# Patient Record
Sex: Male | Born: 1940 | Hispanic: No | Marital: Married | Smoking: Former smoker
Health system: Southern US, Community
[De-identification: ages and names within clinical notes are randomized; demographics above are authoritative.]

## PROBLEM LIST (undated history)

## (undated) DIAGNOSIS — C4492 Squamous cell carcinoma of skin, unspecified: Secondary | ICD-10-CM

## (undated) DIAGNOSIS — C4491 Basal cell carcinoma of skin, unspecified: Secondary | ICD-10-CM

## (undated) DIAGNOSIS — I2510 Atherosclerotic heart disease of native coronary artery without angina pectoris: Secondary | ICD-10-CM

## (undated) HISTORY — DX: Atherosclerotic heart disease of native coronary artery without angina pectoris: I25.10

## (undated) HISTORY — PX: CORONARY ARTERY BYPASS GRAFT: SHX141

---
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
## 1991-10-11 HISTORY — PX: FACIAL LACERATIONS REPAIR: SHX1571

## 1991-10-11 HISTORY — PX: WRIST FRACTURE SURGERY: SHX121

## 2010-08-09 ENCOUNTER — Encounter: Payer: Self-pay | Admitting: Cardiology

## 2010-08-19 ENCOUNTER — Encounter: Payer: Self-pay | Admitting: Gastroenterology

## 2010-09-15 ENCOUNTER — Ambulatory Visit: Payer: Self-pay | Admitting: Gastroenterology

## 2010-09-15 ENCOUNTER — Telehealth (INDEPENDENT_AMBULATORY_CARE_PROVIDER_SITE_OTHER): Payer: Self-pay | Admitting: *Deleted

## 2010-09-15 ENCOUNTER — Ambulatory Visit: Payer: Self-pay | Admitting: Cardiology

## 2010-09-15 DIAGNOSIS — Z951 Presence of aortocoronary bypass graft: Secondary | ICD-10-CM

## 2010-09-15 DIAGNOSIS — E785 Hyperlipidemia, unspecified: Secondary | ICD-10-CM | POA: Insufficient documentation

## 2010-09-15 DIAGNOSIS — I10 Essential (primary) hypertension: Secondary | ICD-10-CM

## 2010-09-15 DIAGNOSIS — E669 Obesity, unspecified: Secondary | ICD-10-CM

## 2010-11-09 NOTE — Assessment & Plan Note (Signed)
Summary: [facility]   Visit Type:  Follow-up Primary Provider:  Dr. Kyra Manges  CC:  CAD/CABG.  History of Present Illness: The patient presents for initial evaluation with our group. He reports two-vessel bypass in 1998 at [facility]. He doesn't report any other testing since that time. He says he has done well he doesn't see doctors. He recently saw Dr. Elana Alm for the first time. He was referred here. He has no symptoms. He says he walks routinely. He has not been on discomfort that was his previous angina. He denies any chest discomfort, neck discomfort. He doesn't report any shortness of breath, PND or orthopnea. He has no palpitations, presyncope or syncope. He has had no edema.  Current Medications (verified): 1)  Aspirin 325 Mg  Tabs (Aspirin) .... As Needed 2)  Ra Vitamin E-Vit A & D 20000 Unit Crea (Vitamin E-Vit  A & D) .Marland Kitchen.. 1 By Mouth Daily  Allergies (verified): No Known Drug Allergies  Past History:  Past Medical History: CAD  Past Surgical History: CABG (2 vessel) Repair of wrist fracture and facial trauma 1993  Family History: Negative for early coronary artery disease though he does not know all the details of his family history.  Social History: He smoked briefly quitting in 1969. He is retired. He is married.  Review of Systems       positive for mild joint pain. Otherwise stated in the history of present illness negative for all other systems.  Vital Signs:  Patient profile:   70 year old male Height:      69 inches Weight:      226 pounds BMI:     33.50 Pulse rate:   67 / minute Resp:     16 per minute BP sitting:   156 / 102  (right arm)  Vitals Entered By: Marrion Coy, CNA (September 15, 2010 1:13 PM)  Physical Exam  General:  Well developed, well nourished, in no acute distress. Head:  normocephalic and atraumatic Eyes:  PERRLA/EOM intact; conjunctiva and lids normal. Mouth:  Teeth, gums and palate normal. Oral mucosa normal. Neck:   Neck supple, no JVD. No masses, thyromegaly or abnormal cervical nodes. Chest Wall:  Well-healed surgical scar Lungs:  Clear bilaterally to auscultation and percussion. Abdomen:  Bowel sounds positive; abdomen soft and non-tender without masses, organomegaly, or hernias noted. No hepatosplenomegaly, obese Msk:  Back normal, normal gait. Muscle strength and tone normal. Extremities:  No clubbing or cyanosis. Neurologic:  Alert and oriented x 3. Skin:  Intact without lesions or rashes. Cervical Nodes:  no significant adenopathy Inguinal Nodes:  no significant adenopathy Psych:  Normal affect.   Detailed Cardiovascular Exam  Neck    Carotids: Carotids full and equal bilaterally without bruits.      Neck Veins: Normal, no JVD.    Heart    Inspection: no deformities or lifts noted.      Palpation: normal PMI with no thrills palpable.      Auscultation: regular rate and rhythm, S1, S2 without murmurs, rubs, gallops, or clicks.    Vascular    Abdominal Aorta: no palpable masses, pulsations, or audible bruits.      Femoral Pulses: normal femoral pulses bilaterally.      Pedal Pulses: normal pedal pulses bilaterally.      Radial Pulses: normal radial pulses bilaterally.      Peripheral Circulation: no clubbing, cyanosis, or edema noted with normal capillary refill.     EKG  Procedure date:  08/09/2010  Findings:      Sinus rhythm, leftward axis, rate 68, nonspecific lateral T wave flattening  Impression & Recommendations:  Problem # 1:  CORONARY ARTERY BYPASS GRAFT, HX OF (ICD-V45.81) He has no symptoms but has 70 year old bypass grafts. Exercise treadmill testing is indicated and this can be done at his primary physician's office.  Problem # 2:  DYSLIPIDEMIA (ICD-272.4) His lipids are at target off of meds.  He wants to avoid meds.  The Heart Protection Study would suggest a benefit to Zocor but I will not push this as the benefit is somewhat marginal vs healthy  lifestyle.  Problem # 3:  OBESITY, UNSPECIFIED (ICD-278.00) We discussed weight loss.  Problem # 4:  ESSENTIAL HYPERTENSION, BENIGN (ICD-401.1) He says his blood pressure is never otherwise elevated and it wasn't at his primary care office. I have given him specific instructions on keeping a blood pressure diary with a goal 140/90 or less.  Patient Instructions: 1)  Your physician recommends that you schedule a follow-up appointment in 18 months. 2)  Your physician recommends that you continue on your current medications as directed. Please refer to the Current Medication list given to you today. 3)  Your physician has requested that you have an exercise tolerance test.  For further information please visit [email or web address]  This test needs to be done at [facility]

## 2010-11-09 NOTE — Letter (Signed)
Summary: Pre Visit Letter Revised  [facility]  [address] [place], [place] [address]   Phone: [phone]  Fax: [phone]        08/19/2010 MRN: [id number]  [facility] [address] [place], [place]  [address]             Procedure Date:  12-21 at 11:30am   Welcome to the Gastroenterology Division at [facility]    You are scheduled to see a nurse for your pre-procedure visit on 09-15-10 at 3:30pm on the 3rd floor at [facility], [address]  We ask that you try to arrive at our office 15 minutes prior to your appointment time to allow for check-in.  Please take a minute to review the attached form.  If you answer "Yes" to one or more of the questions on the first page, we ask that you call the person listed at your earliest opportunity.  If you answer "No" to all of the questions, please complete the rest of the form and bring it to your appointment.    Your nurse visit will consist of discussing your medical and surgical history, your immediate family medical history, and your medications.   If you are unable to list all of your medications on the form, please bring the medication bottles to your appointment and we will list them.  We will need to be aware of both prescribed and over the counter drugs.  We will need to know exact dosage information as well.    Please be prepared to read and sign documents such as consent forms, a financial agreement, and acknowledgement forms.  If necessary, and with your consent, a friend or relative is welcome to sit-in on the nurse visit with you.  Please bring your insurance card so that we may make a copy of it.  If your insurance requires a referral to see a specialist, please bring your referral form from your primary care physician.  No co-pay is required for this nurse visit.     If you cannot keep your appointment, please call [phone] to cancel or reschedule prior to your appointment date.   This allows [place] the opportunity to schedule an appointment for another patient in need of care.    Thank you for choosing [facility] for your medical needs.  We appreciate the opportunity to care for you.  Please visit [place] at our website  to learn more about our practice.  Sincerely, The Gastroenterology Division

## 2010-11-09 NOTE — Progress Notes (Signed)
Summary: Nos for pv  Phone Note Call from Patient   Caller: Patient Details for Reason: pt. nos for pv Summary of Call: Pt. wanted to cancel Colon.  Advised him to call us when he was ready to have his colon.  Pt. acknowledged advice. Initial call taken by: Wyona Almas RN,  September 15, 2010 4:33 PM

## 2010-11-11 NOTE — Consult Note (Signed)
Summary: [facility]   [facility] Referral Request   Imported By: Roderic Ovens 09/23/2010 15:30:23  _____________________________________________________________________  External Attachment:    Type:   Image     Comment:   External Document

## 2011-05-17 ENCOUNTER — Encounter: Payer: Self-pay | Admitting: Cardiology

## 2014-09-02 DIAGNOSIS — C4492 Squamous cell carcinoma of skin, unspecified: Secondary | ICD-10-CM

## 2014-09-02 DIAGNOSIS — C4491 Basal cell carcinoma of skin, unspecified: Secondary | ICD-10-CM

## 2014-09-02 HISTORY — DX: Squamous cell carcinoma of skin, unspecified: C44.92

## 2014-09-02 HISTORY — DX: Basal cell carcinoma of skin, unspecified: C44.91

## 2017-10-10 DIAGNOSIS — C189 Malignant neoplasm of colon, unspecified: Secondary | ICD-10-CM

## 2017-10-10 HISTORY — DX: Malignant neoplasm of colon, unspecified: C18.9

## 2019-04-23 ENCOUNTER — Encounter: Payer: Self-pay | Admitting: *Deleted

## 2019-09-10 DIAGNOSIS — C8590 Non-Hodgkin lymphoma, unspecified, unspecified site: Secondary | ICD-10-CM

## 2019-09-10 HISTORY — DX: Non-Hodgkin lymphoma, unspecified, unspecified site: C85.90

## 2020-03-26 ENCOUNTER — Encounter: Payer: Self-pay | Admitting: Physician Assistant

## 2020-03-26 ENCOUNTER — Other Ambulatory Visit: Payer: Self-pay

## 2020-03-26 ENCOUNTER — Ambulatory Visit (INDEPENDENT_AMBULATORY_CARE_PROVIDER_SITE_OTHER): Payer: Medicare Other | Admitting: Physician Assistant

## 2020-03-26 DIAGNOSIS — L570 Actinic keratosis: Secondary | ICD-10-CM

## 2020-03-26 DIAGNOSIS — D098 Carcinoma in situ of other specified sites: Secondary | ICD-10-CM

## 2020-03-26 DIAGNOSIS — Z85828 Personal history of other malignant neoplasm of skin: Secondary | ICD-10-CM

## 2020-03-26 DIAGNOSIS — D0439 Carcinoma in situ of skin of other parts of face: Secondary | ICD-10-CM | POA: Diagnosis not present

## 2020-03-26 DIAGNOSIS — Z1283 Encounter for screening for malignant neoplasm of skin: Secondary | ICD-10-CM

## 2020-03-26 NOTE — Patient Instructions (Signed)

## 2020-03-26 NOTE — Progress Notes (Addendum)
   Follow-Up Visit   Subjective  Glen Powers is a 79 y.o. male who presents for the following: Skin Problem (right side of nose x several months-  no bleed, just wont heal).   The following portions of the chart were reviewed this encounter and updated as appropriate: Allergies  Meds  Problems  Med Hx  Surg Hx  Fam Hx      Objective  Well appearing patient in no apparent distress; mood and affect are within normal limits.  All skin waist up examined.  Objective  Left Buccal Cheek , Left Ear, Right Buccal Cheek  (2), Right Ear: Erythematous patches with gritty scale.  Objective  Left Ear: Scar clear  Objective  Right Temple: Scar clear- no  signs of nonmole skin ca  Objective  Dorsum of Nose: Hyperkeratotic scale with pink base -please check margins     Assessment & Plan  AK (actinic keratosis) (5) Left Ear; Right Ear; Left Buccal Cheek ; Right Buccal Cheek  (2)  Destruction of lesion - Left Buccal Cheek , Left Ear, Right Buccal Cheek  (2), Right Ear Complexity: simple   Destruction method: cryotherapy   Informed consent: discussed and consent obtained   Timeout:  patient name, date of birth, surgical site, and procedure verified Lesion destroyed using liquid nitrogen: Yes   Cryotherapy cycles:  1 Outcome: patient tolerated procedure well with no complications   Post-procedure details: wound care instructions given    Screening exam for skin cancer waist up exam  History of basal cell carcinoma (BCC) Left Ear  Skin exam  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  Skin exams  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  Epidermal / dermal shaving  Lesion diameter (cm):  1 Informed consent: discussed and consent obtained   Timeout: patient name, date of birth, surgical site, and procedure verified   Procedure prep:  Patient was prepped and draped in usual sterile fashion Prep type:  Chlorhexidine Anesthesia: the  lesion was anesthetized in a standard fashion   Anesthetic:  1% lidocaine w/ epinephrine 1-100,000 local infiltration Instrument used: DermaBlade   Hemostasis achieved with: aluminum chloride   Outcome: patient tolerated procedure well   Post-procedure details: sterile dressing applied and wound care instructions given   Dressing type: petrolatum gauze, petrolatum and bandage    Specimen 1 - Surgical pathology Differential Diagnosis: bcc vs scc Check Margins: No

## 2020-04-07 ENCOUNTER — Telehealth: Payer: Self-pay | Admitting: *Deleted

## 2020-04-07 NOTE — Telephone Encounter (Signed)
-----   Message from Warren Danes, [place] sent at 04/06/2020  1:03 PM EDT ----- Jury Caserta margin. RTC if recurs

## 2020-04-07 NOTE — Telephone Encounter (Signed)
Path to patient. He will follow up if spot comes back.

## 2021-01-16 ENCOUNTER — Encounter (HOSPITAL_COMMUNITY): Payer: Self-pay

## 2021-01-16 ENCOUNTER — Other Ambulatory Visit: Payer: Self-pay

## 2021-01-16 ENCOUNTER — Emergency Department (HOSPITAL_COMMUNITY)
Admission: EM | Admit: 2021-01-16 | Discharge: 2021-01-16 | Disposition: A | Discharge status: Home / Self Care | Payer: Medicare Other | Attending: Emergency Medicine | Admitting: Emergency Medicine

## 2021-01-16 ENCOUNTER — Emergency Department (HOSPITAL_COMMUNITY): Payer: Medicare Other

## 2021-01-16 DIAGNOSIS — I2510 Atherosclerotic heart disease of native coronary artery without angina pectoris: Secondary | ICD-10-CM | POA: Diagnosis not present

## 2021-01-16 DIAGNOSIS — R079 Chest pain, unspecified: Secondary | ICD-10-CM | POA: Insufficient documentation

## 2021-01-16 DIAGNOSIS — W01198A Fall on same level from slipping, tripping and stumbling with subsequent striking against other object, initial encounter: Secondary | ICD-10-CM | POA: Insufficient documentation

## 2021-01-16 DIAGNOSIS — Z85038 Personal history of other malignant neoplasm of large intestine: Secondary | ICD-10-CM | POA: Insufficient documentation

## 2021-01-16 DIAGNOSIS — Z87891 Personal history of nicotine dependence: Secondary | ICD-10-CM | POA: Insufficient documentation

## 2021-01-16 DIAGNOSIS — Z85828 Personal history of other malignant neoplasm of skin: Secondary | ICD-10-CM | POA: Insufficient documentation

## 2021-01-16 DIAGNOSIS — M25512 Pain in left shoulder: Secondary | ICD-10-CM | POA: Diagnosis not present

## 2021-01-16 DIAGNOSIS — Z79899 Other long term (current) drug therapy: Secondary | ICD-10-CM | POA: Insufficient documentation

## 2021-01-16 DIAGNOSIS — S30811A Abrasion of abdominal wall, initial encounter: Secondary | ICD-10-CM | POA: Insufficient documentation

## 2021-01-16 DIAGNOSIS — S3991XA Unspecified injury of abdomen, initial encounter: Secondary | ICD-10-CM | POA: Diagnosis present

## 2021-01-16 DIAGNOSIS — Z951 Presence of aortocoronary bypass graft: Secondary | ICD-10-CM | POA: Insufficient documentation

## 2021-01-16 DIAGNOSIS — W25XXXA Contact with sharp glass, initial encounter: Secondary | ICD-10-CM | POA: Diagnosis not present

## 2021-01-16 DIAGNOSIS — Y9389 Activity, other specified: Secondary | ICD-10-CM | POA: Insufficient documentation

## 2021-01-16 DIAGNOSIS — Z7982 Long term (current) use of aspirin: Secondary | ICD-10-CM | POA: Diagnosis not present

## 2021-01-16 MED ORDER — ACETAMINOPHEN 500 MG PO TABS
1000.0000 mg | ORAL_TABLET | Freq: Once | ORAL | Status: AC
  Administered 2021-01-16: 1000 mg via ORAL
  Filled 2021-01-16: qty 2

## 2021-01-16 NOTE — ED Provider Notes (Signed)
[facility] EMERGENCY DEPARTMENT Provider Note   CSN: [id number] Arrival date & time: 01/16/21  0214     History Chief Complaint  Patient presents with  . Fall    Glen Powers is a 80 y.o. male.  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  Patient states he was sleepwalking tonight he woke up on the floor.  Swallowed a lot of broken glass.  Initially had some pain around his left shoulder and anterior upper left chest thought he had a collarbone fracture.  He states that since that time the pain is improved.  He did sustain a abrasion to his right lower abdomen.  No obvious laceration.  Patient states that he does not have a headache, midline neck or back pain.  No extremity pain.  No pelvis pain.  Patient is ambulatory. No blood thinners.     Past Medical History:  Diagnosis Date  . BCC (basal cell carcinoma) 09/02/2014   left ear-[facility]HS  . Colon cancer ([place]) 2019  . Coronary artery disease   . Lymphoma ([place]) 09/2019  . SCC (squamous cell carcinoma) 09/02/2014   right temple- [place]    Patient Active Problem List   Diagnosis Date Noted  . DYSLIPIDEMIA 09/15/2010  . OBESITY, UNSPECIFIED 09/15/2010  . ESSENTIAL HYPERTENSION, BENIGN 09/15/2010  . CORONARY ARTERY BYPASS GRAFT, HX OF 09/15/2010    Past Surgical History:  Procedure Laterality Date  . CORONARY ARTERY BYPASS GRAFT     2 vessel  . FACIAL LACERATIONS REPAIR  1993   Facial trauma  . WRIST FRACTURE SURGERY  1993       Family History  Problem Relation Age of Onset  . Coronary artery disease Neg Hx        Early    Social History   Tobacco Use  . Smoking status: Former Smoker    Quit date: 10/11/1967    Years since quitting: 53.3  . Smokeless tobacco: Never Used  . Tobacco comment: Smoked briefly  Vaping Use  . Vaping Use: Never used  Substance Use Topics  . Alcohol use: Not Currently    Home Medications Prior to Admission medications   Medication Sig Start Date End Date Taking?  Authorizing Provider  aspirin EC 81 MG tablet Take 81 mg by mouth daily. Swallow whole.    [provider]  ferrous sulfate 325 (65 FE) MG tablet Take 325 mg by mouth daily with breakfast.    [provider]  furosemide (LASIX) 20 MG tablet Take 20 mg by mouth.    [provider]  isosorbide mononitrate (IMDUR) 60 MG 24 hr tablet Take 60 mg by mouth daily.    [provider]  metoprolol succinate (TOPROL-XL) 25 MG 24 hr tablet Take 25 mg by mouth daily.    [provider]  nitroGLYCERIN (NITROSTAT) 0.4 MG SL tablet Place 0.4 mg under the tongue every 5 (five) minutes as needed for chest pain.    [provider]  pyridOXINE (VITAMIN B-6) 100 MG tablet Take 100 mg by mouth daily.    [provider]  RA VITAMIN E-VIT A & D 20000 UNITS CREA Apply 20,000 Units topically daily.      [provider]  rosuvastatin (CRESTOR) 10 MG tablet Take 10 mg by mouth daily.    [provider]    Allergies    Flonase [fluticasone] and Lisinopril  Review of Systems   Review of Systems  All other systems reviewed and are negative.  Physical Exam Updated Vital Signs BP (!) 154/87 (BP Location: Right Arm)   Pulse (!) 59   Temp 98.4 F (36.9 C)   Resp 16   Ht 5\' 9"  (1.753 m)   Wt 95.3 kg   SpO2 95%   BMI 31.01 kg/m   Physical Exam Vitals and nursing note reviewed.  Constitutional:      Appearance: He is well-developed.  HENT:     Head: Normocephalic and atraumatic.     Mouth/Throat:     Mouth: Mucous membranes are moist.     Pharynx: Oropharynx is clear.  Eyes:     Pupils: Pupils are equal, round, and reactive to light.  Cardiovascular:     Rate and Rhythm: Normal rate.  Pulmonary:     Effort: Pulmonary effort is normal. No respiratory distress.  Abdominal:     General: Abdomen is flat. There is no distension.  Musculoskeletal:     Cervical back: Normal range of motion.     Comments: No cervical spine  tenderness, thoracic spine tenderness or Lumbar spine tenderness.  No tenderness or pain with palpation and full ROM of all joints in upper and lower extremities.  No ecchymosis or other signs of trauma on back or extremities.  No Pain with AP or lateral compression of ribs.  Mild Paracervical ttp but no paraspinal ttp   Skin:    General: Skin is warm and dry.     Comments: Abrasion to right lower abdomen, no puncture, no laceration.   Neurological:     General: No focal deficit present.     Mental Status: He is alert.     ED Results / Procedures / Treatments   Labs (all labs ordered are listed, but only abnormal results are displayed) Labs Reviewed - No data to display  EKG None  Radiology DG Chest Portable 1 View  Result Date: 01/16/2021 CLINICAL DATA:  Status post fall. EXAM: PORTABLE CHEST 1 VIEW COMPARISON:  None. FINDINGS: Multiple sternal wires and vascular clips are seen. There is no evidence of acute infiltrate, pleural effusion or pneumothorax. There is moderate to marked severity enlargement of the cardiac silhouette. Moderate to marked severity calcification of the aortic arch is noted. The visualized skeletal structures are unremarkable. IMPRESSION: 1. Evidence of prior median sternotomy/CABG. 2. No acute or active cardiopulmonary disease. Electronically Signed   By: Virgina Norfolk M.D.   On: 01/16/2021 03:36   DG Shoulder Left  Result Date: 01/16/2021 CLINICAL DATA:  Status post fall. EXAM: LEFT SHOULDER - 2+ VIEW COMPARISON:  None. FINDINGS: There is no evidence of fracture or dislocation. There is no evidence of arthropathy or other focal bone abnormality. Multiple sternal wires and vascular clips are noted. Soft tissues are unremarkable. IMPRESSION: No acute osseous abnormality. Electronically Signed   By: Virgina Norfolk M.D.   On: 01/16/2021 03:34    Procedures Procedures   Medications Ordered in ED Medications  acetaminophen (TYLENOL) tablet 1,000 mg  (1,000 mg Oral Given 01/16/21 0354)    ED Course  I have reviewed the triage vital signs and the nursing notes.  Pertinent labs & imaging results that were available during my care of the patient were reviewed by me and considered in my medical decision making (see chart for details).    MDM Rules/Calculators/A&P                          Had some shoulder pain previously, will xr shoulder/chest. Low  suspicion that abrasion on the abdomen violates peritoneal cavity.   xr's normal. Wound care by nursing. Stable for discharge.   Final Clinical Impression(s) / ED Diagnoses Final diagnoses:  Abrasion of abdominal wall, initial encounter    Rx / DC Orders ED Discharge Orders    None       Adelina Collard, Corene Cornea, MD 01/16/21 [phone]

## 2021-01-16 NOTE — ED Triage Notes (Signed)
Pt arrives from home c/o left collar bone and shoulder pain. Pt reports falling into a picture frame this morning but does not remember exactly what happened, as Pt reports he sleep walks and was dreaming when it happened. Pt has minor skin tear to right abdomen. Bleeding controlled at this time.

## 2021-10-13 ENCOUNTER — Encounter: Payer: Self-pay | Admitting: Physician Assistant

## 2021-10-13 ENCOUNTER — Other Ambulatory Visit: Payer: Self-pay

## 2021-10-13 ENCOUNTER — Ambulatory Visit (INDEPENDENT_AMBULATORY_CARE_PROVIDER_SITE_OTHER): Payer: Medicare Other | Admitting: Physician Assistant

## 2021-10-13 DIAGNOSIS — C44319 Basal cell carcinoma of skin of other parts of face: Secondary | ICD-10-CM

## 2021-10-13 DIAGNOSIS — L570 Actinic keratosis: Secondary | ICD-10-CM

## 2021-10-13 DIAGNOSIS — I781 Nevus, non-neoplastic: Secondary | ICD-10-CM | POA: Diagnosis not present

## 2021-10-13 DIAGNOSIS — C44310 Basal cell carcinoma of skin of unspecified parts of face: Secondary | ICD-10-CM

## 2021-10-13 DIAGNOSIS — C4491 Basal cell carcinoma of skin, unspecified: Secondary | ICD-10-CM

## 2021-10-13 DIAGNOSIS — Z85828 Personal history of other malignant neoplasm of skin: Secondary | ICD-10-CM | POA: Diagnosis not present

## 2021-10-13 DIAGNOSIS — D485 Neoplasm of uncertain behavior of skin: Secondary | ICD-10-CM

## 2021-10-13 HISTORY — DX: Basal cell carcinoma of skin, unspecified: C44.91

## 2021-10-13 NOTE — Patient Instructions (Signed)

## 2021-10-14 ENCOUNTER — Encounter: Payer: Self-pay | Admitting: Physician Assistant

## 2021-10-14 NOTE — Progress Notes (Signed)
° °  Follow-Up Visit   Subjective  Glen Powers is a 81 y.o. male who presents for the following: Skin Problem (Lesions on right temple x years, no itching no bleeding. Patient has personal history of bcc and scc with mohs treatment).   The following portions of the chart were reviewed this encounter and updated as appropriate:  Tobacco   Allergies   Meds   Problems   Med Hx   Surg Hx   Fam Hx       Objective  Well appearing patient in no apparent distress; mood and affect are within normal limits.  A focused examination was performed including face. Relevant physical exam findings are noted in the Assessment and Plan.  4 left cheek 3 right cheek (7) Erythematous patches with gritty scale.  right temple inf. Pearly papule with telangectasia.       Assessment & Plan  AK (actinic keratosis) (7) 4 left cheek 3 right cheek  Destruction of lesion - 4 left cheek 3 right cheek Complexity: simple   Destruction method: cryotherapy   Informed consent: discussed and consent obtained   Timeout:  patient name, date of birth, surgical site, and procedure verified Lesion destroyed using liquid nitrogen: Yes   Cryotherapy cycles:  3 Outcome: patient tolerated procedure well with no complications   Post-procedure details: wound care instructions given    BCC (basal cell carcinoma), face right temple inf.  Skin / nail biopsy Type of biopsy: tangential   Informed consent: discussed and consent obtained   Timeout: patient name, date of birth, surgical site, and procedure verified   Anesthesia: the lesion was anesthetized in a standard fashion   Anesthetic:  1% lidocaine w/ epinephrine 1-100,000 local infiltration Instrument used: flexible razor blade   Hemostasis achieved with: aluminum chloride and electrodesiccation   Outcome: patient tolerated procedure well   Post-procedure details: wound care instructions given    Destruction of lesion Complexity: simple   Destruction method:  electrodesiccation and curettage   Informed consent: discussed and consent obtained   Timeout:  patient name, date of birth, surgical site, and procedure verified Anesthesia: the lesion was anesthetized in a standard fashion   Anesthetic:  1% lidocaine w/ epinephrine 1-100,000 local infiltration Curettage performed in three different directions: Yes   Electrodesiccation performed over the curetted area: Yes   Curettage cycles:  3 Margin per side (cm):  0.1 Final wound size (cm):  1.8 Hemostasis achieved with:  aluminum chloride Outcome: patient tolerated procedure well with no complications   Post-procedure details: wound care instructions given    Specimen 1 - Surgical pathology Differential Diagnosis: bcc scc tx with bx  Check Margins: No    I, Rhiley Tarver, PA-C, have reviewed all documentation's for this visit.  The documentation on 10/14/21 for the exam, diagnosis, procedures and orders are all accurate and complete.

## 2021-10-18 ENCOUNTER — Telehealth: Payer: Self-pay

## 2021-10-18 NOTE — Telephone Encounter (Signed)
-----   Message from Warren Danes, [place] sent at 10/14/2021  3:52 PM EST ----- Tx with biopsy. RTC if recur. Recheck 6 months.

## 2021-10-18 NOTE — Telephone Encounter (Signed)
Phone call from patient's wife wanting his pathology results. Pathology results given to patient's wife.

## 2021-10-18 NOTE — Telephone Encounter (Signed)
Phone call to patient with his pathology results. Voicemail left for patient to give the office a call back.  ?

## 2021-10-20 ENCOUNTER — Ambulatory Visit: Payer: Medicare Other | Admitting: Physician Assistant

## 2022-04-13 ENCOUNTER — Encounter: Payer: Self-pay | Admitting: Physician Assistant

## 2022-04-13 ENCOUNTER — Ambulatory Visit (INDEPENDENT_AMBULATORY_CARE_PROVIDER_SITE_OTHER): Payer: Medicare Other | Admitting: Physician Assistant

## 2022-04-13 DIAGNOSIS — Z85828 Personal history of other malignant neoplasm of skin: Secondary | ICD-10-CM

## 2022-04-13 DIAGNOSIS — L570 Actinic keratosis: Secondary | ICD-10-CM | POA: Diagnosis not present

## 2022-04-13 DIAGNOSIS — D485 Neoplasm of uncertain behavior of skin: Secondary | ICD-10-CM

## 2022-04-13 NOTE — Patient Instructions (Signed)

## 2022-04-14 ENCOUNTER — Encounter: Payer: Self-pay | Admitting: Physician Assistant

## 2022-04-14 NOTE — Progress Notes (Signed)
   Follow-Up Visit   Subjective  Rubin Dais is a 81 y.o. male who presents for the following: Follow-up (Patient here today with his wife for follow up after treatment of [place] right temple. Per patient and his wife they have been watching a lesion that [facility], PA-C told them to watch and they haven't noticed any change. ).   The following portions of the chart were reviewed this encounter and updated as appropriate:  Tobacco  Allergies  Meds  Problems  Med Hx  Surg Hx  Fam Hx      Objective  Well appearing patient in no apparent distress; mood and affect are within normal limits.  A focused examination was performed including face. Relevant physical exam findings are noted in the Assessment and Plan.  Dorsum of Nose Hyperkeratotic scale with pink base         Assessment & Plan  Neoplasm of uncertain behavior of skin Dorsum of Nose  Skin / nail biopsy Type of biopsy: tangential   Informed consent: discussed and consent obtained   Timeout: patient name, date of birth, surgical site, and procedure verified   Anesthesia: the lesion was anesthetized in a standard fashion   Anesthetic:  1% lidocaine w/ epinephrine 1-100,000 local infiltration Instrument used: flexible razor blade   Hemostasis achieved with: ferric subsulfate   Outcome: patient tolerated procedure well   Post-procedure details: wound care instructions given    Specimen 1 - Surgical pathology Differential Diagnosis: scc vs bcc  Check Margins: No    I, Mallery Harshman, PA-C, have reviewed all documentation's for this visit.  The documentation on 04/14/22 for the exam, diagnosis, procedures and orders are all accurate and complete.

## 2022-04-18 ENCOUNTER — Telehealth: Payer: Self-pay | Admitting: Physician Assistant

## 2022-04-18 NOTE — Telephone Encounter (Signed)
Phone call to patient with her pathology results. Patient aware of results.  

## 2022-04-18 NOTE — Telephone Encounter (Signed)
Results, KRS 

## 2022-04-19 ENCOUNTER — Telehealth: Payer: Self-pay | Admitting: *Deleted

## 2022-04-19 NOTE — Telephone Encounter (Signed)
-----   Message from Warren Danes, [place] sent at 04/19/2022 12:49 PM EDT ----- RTC if recurs

## 2022-04-19 NOTE — Telephone Encounter (Signed)
Pathology to patient.  °

## 2022-11-01 IMAGING — DX DG SHOULDER 2+V*L*
2 series · 2 of 2 positions shown · non-contrast
Comparison: None.

CLINICAL DATA: Status post fall.

EXAM:
LEFT SHOULDER - 2+ VIEW

[shoulder obl]
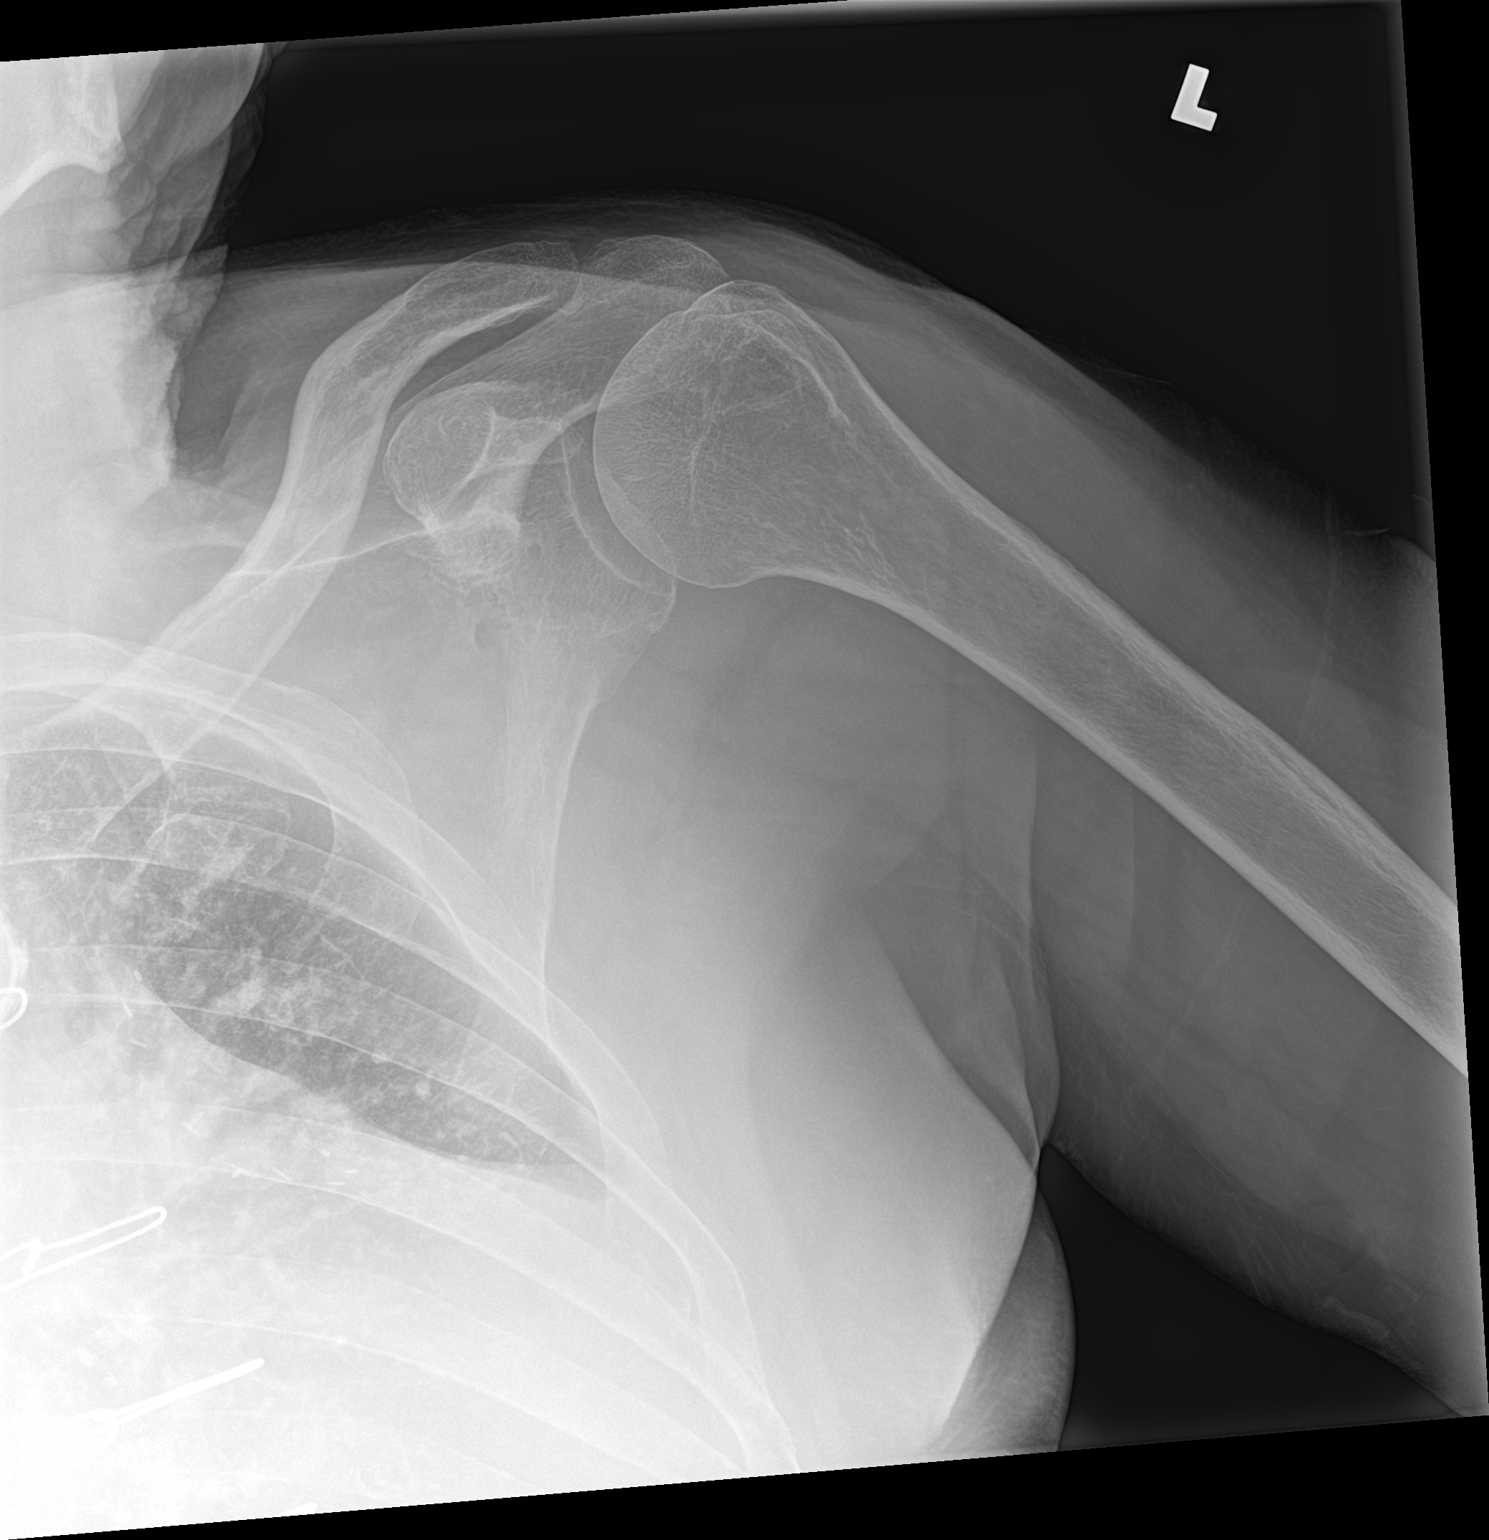

[shoulder swimmer]
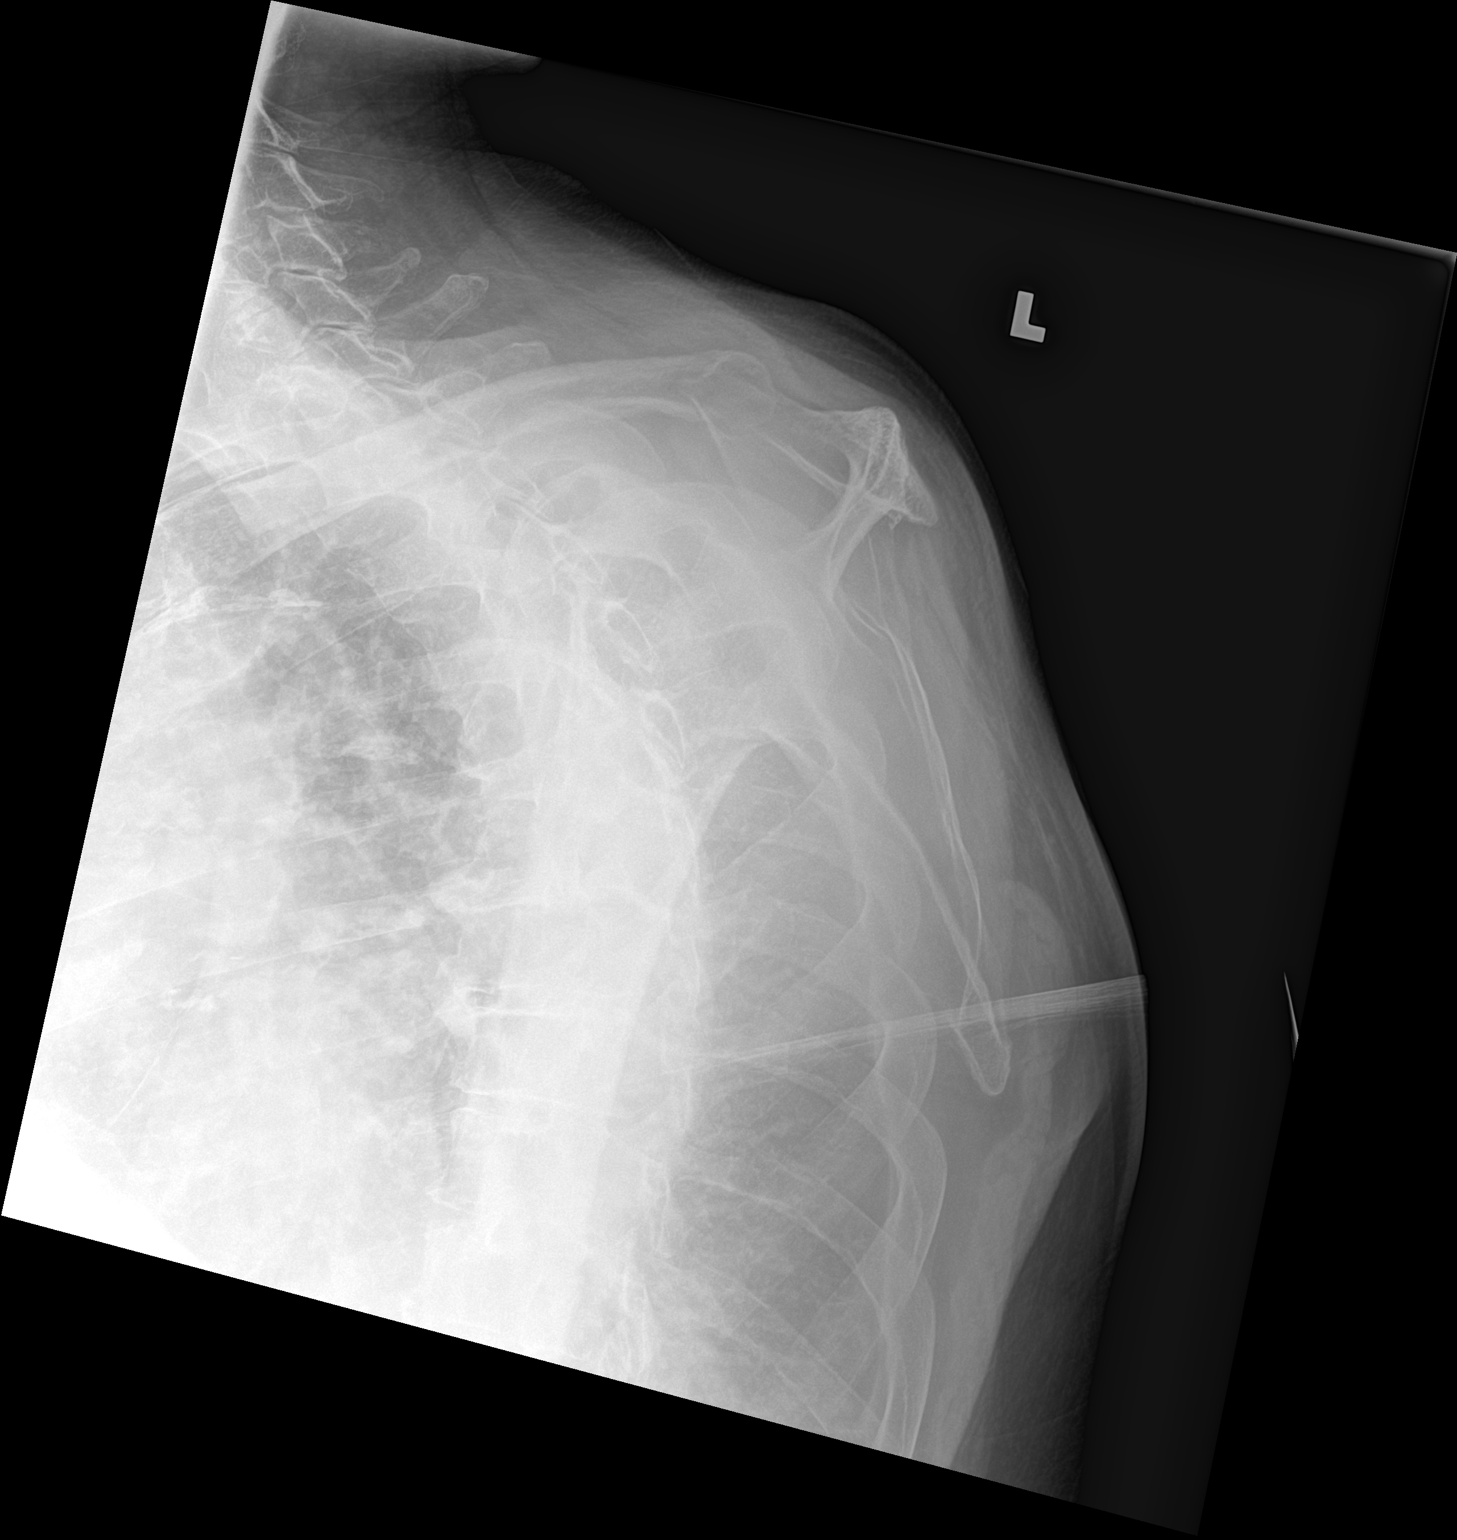

[2 of 2 positions shown; findings below may reference images not displayed]

FINDINGS: There is no evidence of fracture or dislocation. There is no
evidence of arthropathy or other focal bone abnormality. Multiple
sternal wires and vascular clips are noted. Soft tissues are
unremarkable.
IMPRESSION: No acute osseous abnormality.

## 2022-11-01 IMAGING — DX DG CHEST 1V PORT
1 series · 1 of 1 positions shown · non-contrast
Comparison: None.

CLINICAL DATA: Status post fall.

EXAM:
PORTABLE CHEST 1 VIEW

[chest ap]
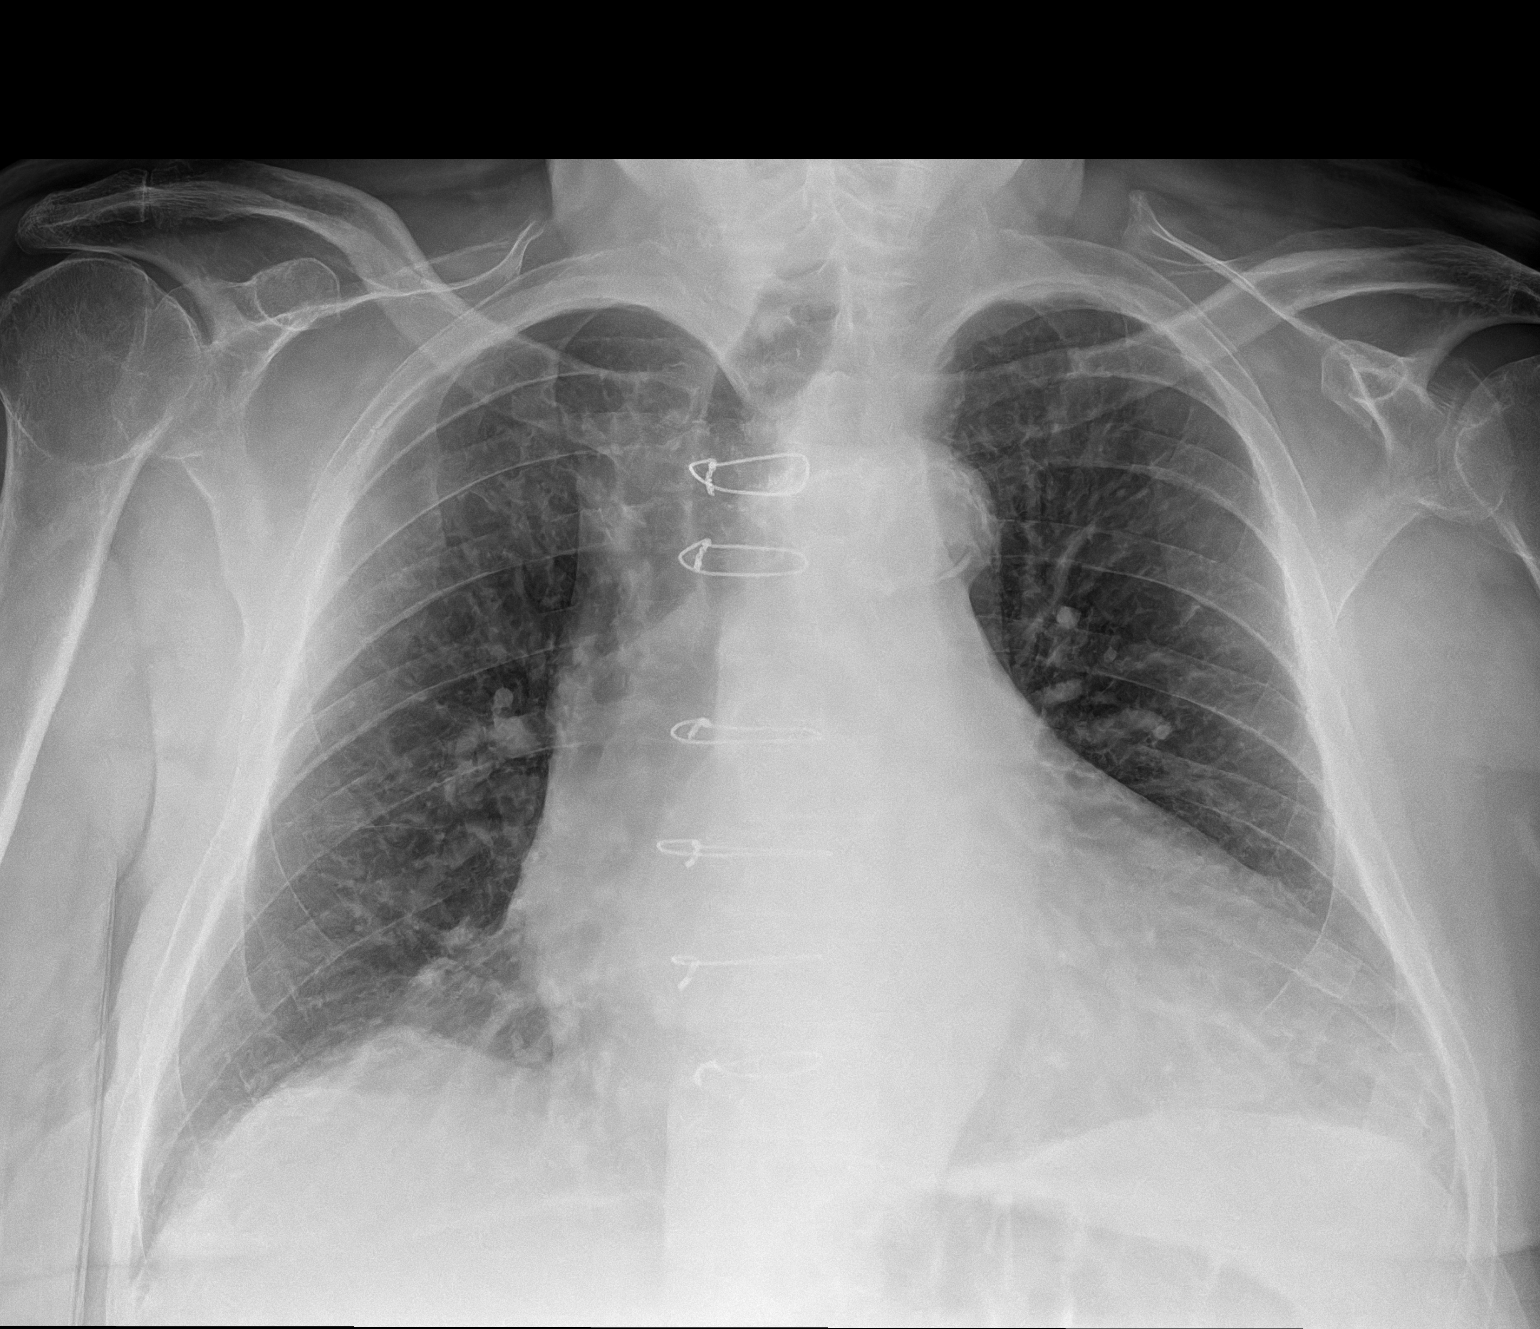

[1 of 1 positions shown; findings below may reference images not displayed]

FINDINGS: Multiple sternal wires and vascular clips are seen. There is no
evidence of acute infiltrate, pleural effusion or pneumothorax.
There is moderate to marked severity enlargement of the cardiac
silhouette. Moderate to marked severity calcification of the aortic
arch is noted. The visualized skeletal structures are unremarkable.
IMPRESSION: 1. Evidence of prior median sternotomy/CABG.
2. No acute or active cardiopulmonary disease.

## 2024-09-02 DIAGNOSIS — J9621 Acute and chronic respiratory failure with hypoxia: Secondary | ICD-10-CM | POA: Diagnosis not present

## 2024-09-02 DIAGNOSIS — J189 Pneumonia, unspecified organism: Secondary | ICD-10-CM | POA: Diagnosis not present

## 2024-09-02 DIAGNOSIS — Z930 Tracheostomy status: Secondary | ICD-10-CM | POA: Diagnosis not present

## 2024-09-02 DIAGNOSIS — I480 Paroxysmal atrial fibrillation: Secondary | ICD-10-CM | POA: Diagnosis not present

## 2024-09-03 DIAGNOSIS — J9621 Acute and chronic respiratory failure with hypoxia: Secondary | ICD-10-CM | POA: Diagnosis not present

## 2024-09-03 DIAGNOSIS — J189 Pneumonia, unspecified organism: Secondary | ICD-10-CM | POA: Diagnosis not present

## 2024-09-03 DIAGNOSIS — Z930 Tracheostomy status: Secondary | ICD-10-CM | POA: Diagnosis not present

## 2024-09-03 DIAGNOSIS — I480 Paroxysmal atrial fibrillation: Secondary | ICD-10-CM | POA: Diagnosis not present

## 2024-09-04 DIAGNOSIS — J189 Pneumonia, unspecified organism: Secondary | ICD-10-CM | POA: Diagnosis not present

## 2024-09-04 DIAGNOSIS — Z930 Tracheostomy status: Secondary | ICD-10-CM | POA: Diagnosis not present

## 2024-09-04 DIAGNOSIS — I482 Chronic atrial fibrillation, unspecified: Secondary | ICD-10-CM | POA: Diagnosis not present

## 2024-09-04 DIAGNOSIS — J9621 Acute and chronic respiratory failure with hypoxia: Secondary | ICD-10-CM | POA: Diagnosis not present

## 2024-09-05 DIAGNOSIS — Z930 Tracheostomy status: Secondary | ICD-10-CM | POA: Diagnosis not present

## 2024-09-05 DIAGNOSIS — I480 Paroxysmal atrial fibrillation: Secondary | ICD-10-CM | POA: Diagnosis not present

## 2024-09-05 DIAGNOSIS — J9621 Acute and chronic respiratory failure with hypoxia: Secondary | ICD-10-CM | POA: Diagnosis not present

## 2024-09-05 DIAGNOSIS — J189 Pneumonia, unspecified organism: Secondary | ICD-10-CM | POA: Diagnosis not present

## 2024-09-06 DIAGNOSIS — J189 Pneumonia, unspecified organism: Secondary | ICD-10-CM | POA: Diagnosis not present

## 2024-09-06 DIAGNOSIS — J9621 Acute and chronic respiratory failure with hypoxia: Secondary | ICD-10-CM | POA: Diagnosis not present

## 2024-09-06 DIAGNOSIS — I480 Paroxysmal atrial fibrillation: Secondary | ICD-10-CM | POA: Diagnosis not present

## 2024-09-06 DIAGNOSIS — Z930 Tracheostomy status: Secondary | ICD-10-CM | POA: Diagnosis not present

## 2024-09-07 DIAGNOSIS — J189 Pneumonia, unspecified organism: Secondary | ICD-10-CM | POA: Diagnosis not present

## 2024-09-07 DIAGNOSIS — J9621 Acute and chronic respiratory failure with hypoxia: Secondary | ICD-10-CM | POA: Diagnosis not present

## 2024-09-07 DIAGNOSIS — Z930 Tracheostomy status: Secondary | ICD-10-CM | POA: Diagnosis not present

## 2024-09-07 DIAGNOSIS — I480 Paroxysmal atrial fibrillation: Secondary | ICD-10-CM | POA: Diagnosis not present

## 2024-09-08 DIAGNOSIS — J189 Pneumonia, unspecified organism: Secondary | ICD-10-CM | POA: Diagnosis not present

## 2024-09-08 DIAGNOSIS — I480 Paroxysmal atrial fibrillation: Secondary | ICD-10-CM | POA: Diagnosis not present

## 2024-09-08 DIAGNOSIS — Z930 Tracheostomy status: Secondary | ICD-10-CM | POA: Diagnosis not present

## 2024-09-08 DIAGNOSIS — J9621 Acute and chronic respiratory failure with hypoxia: Secondary | ICD-10-CM | POA: Diagnosis not present

## 2024-09-19 DIAGNOSIS — G9341 Metabolic encephalopathy: Secondary | ICD-10-CM | POA: Diagnosis not present

## 2024-09-20 DIAGNOSIS — G9341 Metabolic encephalopathy: Secondary | ICD-10-CM | POA: Diagnosis not present

## 2024-10-06 ENCOUNTER — Inpatient Hospital Stay (HOSPITAL_COMMUNITY)

## 2024-10-06 ENCOUNTER — Emergency Department (HOSPITAL_COMMUNITY)

## 2024-10-06 ENCOUNTER — Inpatient Hospital Stay (HOSPITAL_COMMUNITY)
Admission: EM | Admit: 2024-10-06 | Discharge: 2024-10-10 | DRG: 208 | Disposition: E | Source: Other Acute Inpatient Hospital | Attending: Emergency Medicine | Admitting: Emergency Medicine

## 2024-10-06 DIAGNOSIS — J95851 Ventilator associated pneumonia: Secondary | ICD-10-CM | POA: Diagnosis present

## 2024-10-06 DIAGNOSIS — I959 Hypotension, unspecified: Secondary | ICD-10-CM | POA: Diagnosis not present

## 2024-10-06 DIAGNOSIS — I509 Heart failure, unspecified: Secondary | ICD-10-CM | POA: Diagnosis present

## 2024-10-06 DIAGNOSIS — Z931 Gastrostomy status: Secondary | ICD-10-CM | POA: Diagnosis not present

## 2024-10-06 DIAGNOSIS — I110 Hypertensive heart disease with heart failure: Secondary | ICD-10-CM | POA: Diagnosis present

## 2024-10-06 DIAGNOSIS — J9621 Acute and chronic respiratory failure with hypoxia: Secondary | ICD-10-CM | POA: Diagnosis present

## 2024-10-06 DIAGNOSIS — Z6831 Body mass index (BMI) 31.0-31.9, adult: Secondary | ICD-10-CM | POA: Diagnosis not present

## 2024-10-06 DIAGNOSIS — Z79899 Other long term (current) drug therapy: Secondary | ICD-10-CM

## 2024-10-06 DIAGNOSIS — Z85828 Personal history of other malignant neoplasm of skin: Secondary | ICD-10-CM | POA: Diagnosis not present

## 2024-10-06 DIAGNOSIS — D490 Neoplasm of unspecified behavior of digestive system: Secondary | ICD-10-CM | POA: Diagnosis present

## 2024-10-06 DIAGNOSIS — J9500 Unspecified tracheostomy complication: Secondary | ICD-10-CM

## 2024-10-06 DIAGNOSIS — R739 Hyperglycemia, unspecified: Secondary | ICD-10-CM | POA: Diagnosis not present

## 2024-10-06 DIAGNOSIS — R4182 Altered mental status, unspecified: Secondary | ICD-10-CM

## 2024-10-06 DIAGNOSIS — R1310 Dysphagia, unspecified: Secondary | ICD-10-CM | POA: Diagnosis present

## 2024-10-06 DIAGNOSIS — J90 Pleural effusion, not elsewhere classified: Secondary | ICD-10-CM | POA: Diagnosis not present

## 2024-10-06 DIAGNOSIS — E46 Unspecified protein-calorie malnutrition: Secondary | ICD-10-CM | POA: Diagnosis present

## 2024-10-06 DIAGNOSIS — Z85038 Personal history of other malignant neoplasm of large intestine: Secondary | ICD-10-CM

## 2024-10-06 DIAGNOSIS — I2510 Atherosclerotic heart disease of native coronary artery without angina pectoris: Secondary | ICD-10-CM | POA: Diagnosis present

## 2024-10-06 DIAGNOSIS — D696 Thrombocytopenia, unspecified: Secondary | ICD-10-CM

## 2024-10-06 DIAGNOSIS — D649 Anemia, unspecified: Secondary | ICD-10-CM | POA: Diagnosis not present

## 2024-10-06 DIAGNOSIS — Z87891 Personal history of nicotine dependence: Secondary | ICD-10-CM

## 2024-10-06 DIAGNOSIS — G4733 Obstructive sleep apnea (adult) (pediatric): Secondary | ICD-10-CM | POA: Diagnosis present

## 2024-10-06 DIAGNOSIS — Y95 Nosocomial condition: Secondary | ICD-10-CM | POA: Diagnosis present

## 2024-10-06 DIAGNOSIS — D62 Acute posthemorrhagic anemia: Secondary | ICD-10-CM | POA: Diagnosis present

## 2024-10-06 DIAGNOSIS — N179 Acute kidney failure, unspecified: Secondary | ICD-10-CM | POA: Diagnosis present

## 2024-10-06 DIAGNOSIS — Z66 Do not resuscitate: Secondary | ICD-10-CM | POA: Diagnosis present

## 2024-10-06 DIAGNOSIS — Z930 Tracheostomy status: Secondary | ICD-10-CM

## 2024-10-06 DIAGNOSIS — Z923 Personal history of irradiation: Secondary | ICD-10-CM

## 2024-10-06 DIAGNOSIS — J9501 Hemorrhage from tracheostomy stoma: Secondary | ICD-10-CM | POA: Diagnosis present

## 2024-10-06 DIAGNOSIS — R578 Other shock: Secondary | ICD-10-CM | POA: Diagnosis present

## 2024-10-06 DIAGNOSIS — G928 Other toxic encephalopathy: Secondary | ICD-10-CM | POA: Diagnosis present

## 2024-10-06 DIAGNOSIS — Y848 Other medical procedures as the cause of abnormal reaction of the patient, or of later complication, without mention of misadventure at the time of the procedure: Secondary | ICD-10-CM | POA: Diagnosis present

## 2024-10-06 DIAGNOSIS — Z7901 Long term (current) use of anticoagulants: Secondary | ICD-10-CM | POA: Diagnosis not present

## 2024-10-06 DIAGNOSIS — R410 Disorientation, unspecified: Secondary | ICD-10-CM | POA: Diagnosis present

## 2024-10-06 DIAGNOSIS — J189 Pneumonia, unspecified organism: Secondary | ICD-10-CM

## 2024-10-06 DIAGNOSIS — R627 Adult failure to thrive: Secondary | ICD-10-CM | POA: Diagnosis present

## 2024-10-06 DIAGNOSIS — C8590 Non-Hodgkin lymphoma, unspecified, unspecified site: Secondary | ICD-10-CM

## 2024-10-06 DIAGNOSIS — J387 Other diseases of larynx: Secondary | ICD-10-CM | POA: Diagnosis present

## 2024-10-06 DIAGNOSIS — I482 Chronic atrial fibrillation, unspecified: Secondary | ICD-10-CM | POA: Diagnosis present

## 2024-10-06 DIAGNOSIS — C8511 Unspecified B-cell lymphoma, lymph nodes of head, face, and neck: Secondary | ICD-10-CM | POA: Diagnosis present

## 2024-10-06 DIAGNOSIS — Z951 Presence of aortocoronary bypass graft: Secondary | ICD-10-CM | POA: Diagnosis not present

## 2024-10-06 LAB — COMPREHENSIVE METABOLIC PANEL WITH GFR
ALT: 19 U/L (ref 0–44)
AST: 34 U/L (ref 15–41)
Albumin: 1.8 g/dL — ABNORMAL LOW (ref 3.5–5.0)
Alkaline Phosphatase: 85 U/L (ref 38–126)
Anion gap: 9 (ref 5–15)
BUN: 77 mg/dL — ABNORMAL HIGH (ref 8–23)
CO2: 36 mmol/L — ABNORMAL HIGH (ref 22–32)
Calcium: 8.2 mg/dL — ABNORMAL LOW (ref 8.9–10.3)
Chloride: 94 mmol/L — ABNORMAL LOW (ref 98–111)
Creatinine, Ser: 1.51 mg/dL — ABNORMAL HIGH (ref 0.61–1.24)
GFR, Estimated: 45 mL/min — ABNORMAL LOW
Glucose, Bld: 163 mg/dL — ABNORMAL HIGH (ref 70–99)
Potassium: 4.8 mmol/L (ref 3.5–5.1)
Sodium: 138 mmol/L (ref 135–145)
Total Bilirubin: 0.8 mg/dL (ref 0.0–1.2)
Total Protein: 6.3 g/dL — ABNORMAL LOW (ref 6.5–8.1)

## 2024-10-06 LAB — PREPARE RBC (CROSSMATCH)

## 2024-10-06 LAB — CBC
HCT: 19.4 % — ABNORMAL LOW (ref 39.0–52.0)
HCT: 29.1 % — ABNORMAL LOW (ref 39.0–52.0)
Hemoglobin: 5.7 g/dL — CL (ref 13.0–17.0)
Hemoglobin: 9 g/dL — ABNORMAL LOW (ref 13.0–17.0)
MCH: 30.8 pg (ref 26.0–34.0)
MCH: 30.5 pg (ref 26.0–34.0)
MCHC: 29.4 g/dL — ABNORMAL LOW (ref 30.0–36.0)
MCHC: 30.9 g/dL (ref 30.0–36.0)
MCV: 104.9 fL — ABNORMAL HIGH (ref 80.0–100.0)
MCV: 98.6 fL (ref 80.0–100.0)
Platelets: 144 K/uL — ABNORMAL LOW (ref 150–400)
Platelets: 165 K/uL (ref 150–400)
RBC: 1.85 MIL/uL — ABNORMAL LOW (ref 4.22–5.81)
RBC: 2.95 MIL/uL — ABNORMAL LOW (ref 4.22–5.81)
RDW: 17.9 % — ABNORMAL HIGH (ref 11.5–15.5)
RDW: 19.9 % — ABNORMAL HIGH (ref 11.5–15.5)
WBC: 4.7 K/uL (ref 4.0–10.5)
WBC: 7.1 K/uL (ref 4.0–10.5)
nRBC: 0.4 % — ABNORMAL HIGH (ref 0.0–0.2)
nRBC: 0 % (ref 0.0–0.2)

## 2024-10-06 LAB — CBG MONITORING, ED
Glucose-Capillary: 137 mg/dL — ABNORMAL HIGH (ref 70–99)
Glucose-Capillary: 110 mg/dL — ABNORMAL HIGH (ref 70–99)
Glucose-Capillary: 126 mg/dL — ABNORMAL HIGH (ref 70–99)

## 2024-10-06 LAB — HEMOGLOBIN A1C
Hgb A1c MFr Bld: 6.1 % — ABNORMAL HIGH (ref 4.8–5.6)
Mean Plasma Glucose: 128.37 mg/dL

## 2024-10-06 LAB — ABO/RH: ABO/RH(D): A POS

## 2024-10-06 LAB — PROTIME-INR
INR: 1.1 (ref 0.8–1.2)
Prothrombin Time: 14.7 s (ref 11.4–15.2)

## 2024-10-06 MED ORDER — MELATONIN 3 MG PO TABS
3.0000 mg | ORAL_TABLET | Freq: Every day | ORAL | Status: DC
  Administered 2024-10-06: 3 mg via ORAL
  Filled 2024-10-06: qty 1

## 2024-10-06 MED ORDER — DOCUSATE SODIUM 100 MG PO CAPS: 100.0000 mg | ORAL_CAPSULE | Freq: Two times a day (BID) | ORAL | Status: DC | PRN

## 2024-10-06 MED ORDER — ADULT MULTIVITAMIN W/MINERALS CH: 1.0000 | ORAL_TABLET | Freq: Every day | ORAL | Status: DC

## 2024-10-06 MED ORDER — VANCOMYCIN HCL 1750 MG/350ML IV SOLN
1750.0000 mg | Freq: Once | INTRAVENOUS | Status: AC
  Administered 2024-10-06: 1750 mg via INTRAVENOUS
  Filled 2024-10-06: qty 350

## 2024-10-06 MED ORDER — SODIUM CHLORIDE 3 % IN NEBU
4.0000 mL | INHALATION_SOLUTION | Freq: Four times a day (QID) | RESPIRATORY_TRACT | Status: DC
  Administered 2024-10-06: 4 mL via RESPIRATORY_TRACT
  Filled 2024-10-06 (×3): qty 4

## 2024-10-06 MED ORDER — DEXMEDETOMIDINE HCL IN NACL 400 MCG/100ML IV SOLN
0.0000 ug/kg/h | INTRAVENOUS | Status: DC
  Administered 2024-10-07: 0.4 ug/kg/h via INTRAVENOUS

## 2024-10-06 MED ORDER — MIDODRINE HCL 5 MG PO TABS
10.0000 mg | ORAL_TABLET | Freq: Three times a day (TID) | ORAL | Status: DC
  Administered 2024-10-06 – 2024-10-07 (×2): 10 mg
  Filled 2024-10-06: qty 2

## 2024-10-06 MED ORDER — CHLORHEXIDINE GLUCONATE CLOTH 2 % EX PADS: 6.0000 | MEDICATED_PAD | Freq: Every day | CUTANEOUS | Status: DC

## 2024-10-06 MED ORDER — POLYETHYLENE GLYCOL 3350 17 G PO PACK: 17.0000 g | PACK | Freq: Every day | ORAL | Status: DC

## 2024-10-06 MED ORDER — OLANZAPINE 10 MG PO TABS
5.0000 mg | ORAL_TABLET | Freq: Every day | ORAL | Status: DC
  Administered 2024-10-06: 5 mg via ORAL
  Filled 2024-10-06: qty 1

## 2024-10-06 MED ORDER — POLYETHYLENE GLYCOL 3350 17 G PO PACK: 17.0000 g | PACK | Freq: Every day | ORAL | Status: DC | PRN

## 2024-10-06 MED ORDER — THIAMINE MONONITRATE 100 MG PO TABS: 100.0000 mg | ORAL_TABLET | Freq: Every day | ORAL | Status: DC

## 2024-10-06 MED ORDER — INSULIN ASPART 100 UNIT/ML IJ SOLN
0.0000 [IU] | INTRAMUSCULAR | Status: DC
  Administered 2024-10-06: 2 [IU] via SUBCUTANEOUS
  Filled 2024-10-06: qty 2

## 2024-10-06 MED ORDER — SODIUM CHLORIDE 0.9 % IV SOLN: INTRAVENOUS | Status: DC

## 2024-10-06 MED ORDER — NOREPINEPHRINE 4 MG/250ML-% IV SOLN
0.0000 ug/min | INTRAVENOUS | Status: DC
  Administered 2024-10-06: 5 ug/min via INTRAVENOUS
  Filled 2024-10-06: qty 250

## 2024-10-06 MED ORDER — VANCOMYCIN HCL IN DEXTROSE 1-5 GM/200ML-% IV SOLN: 1000.0000 mg | INTRAVENOUS | Status: DC

## 2024-10-06 MED ORDER — SODIUM CHLORIDE 0.9% IV SOLUTION: Freq: Once | INTRAVENOUS | Status: AC

## 2024-10-06 MED ORDER — SODIUM CHLORIDE 0.9 % IV SOLN: 250.0000 mL | INTRAVENOUS | Status: DC

## 2024-10-06 MED ORDER — ALBUTEROL SULFATE (2.5 MG/3ML) 0.083% IN NEBU
2.5000 mg | INHALATION_SOLUTION | Freq: Four times a day (QID) | RESPIRATORY_TRACT | Status: DC
  Administered 2024-10-06 – 2024-10-07 (×2): 2.5 mg via RESPIRATORY_TRACT
  Filled 2024-10-06: qty 3

## 2024-10-06 MED ORDER — SODIUM CHLORIDE 0.9 % IV SOLN
1.0000 g | Freq: Two times a day (BID) | INTRAVENOUS | Status: DC
  Administered 2024-10-06: 1 g via INTRAVENOUS
  Filled 2024-10-06: qty 20

## 2024-10-06 MED ORDER — FAMOTIDINE 20 MG PO TABS
20.0000 mg | ORAL_TABLET | Freq: Two times a day (BID) | ORAL | Status: DC
  Administered 2024-10-06: 20 mg
  Filled 2024-10-06: qty 1

## 2024-10-06 MED ORDER — IOHEXOL 350 MG/ML SOLN
75.0000 mL | Freq: Once | INTRAVENOUS | Status: AC | PRN
  Administered 2024-10-06: 75 mL via INTRAVENOUS

## 2024-10-06 MED ORDER — ROSUVASTATIN CALCIUM 5 MG PO TABS: 10.0000 mg | ORAL_TABLET | Freq: Every day | ORAL | Status: DC

## 2024-10-06 NOTE — ED Notes (Signed)
 Was not able to get Blood cultures prior to first dose of ABX due to lack of IV access.

## 2024-10-06 NOTE — ED Notes (Signed)
 Pt'S bp IS 77/46. ICU NP made aware. Levo added.

## 2024-10-06 NOTE — Progress Notes (Signed)
 Pharmacy Antibiotic Note  Glen Powers is a 83 y.o. male admitted on 10/06/2024 presenting from [facility] with trach issues, concern for healthcare associated pna.  Pharmacy has been consulted for vancomycin  and merrem  dosing.  Plan: Vancomycin  1750 mg IV x 1, then 1g IV q 24h (eAUC 490) Merrem  1g IV q 12h Monitor renal function, Cx/PCR to narrow Vancomycin  levels as indicated  Height: 5' 9 (175.3 cm) IBW/kg (Calculated) : 70.7  Temp (24hrs), Avg:98.2 F (36.8 C), Min:98.1 F (36.7 C), Max:98.2 F (36.8 C)  Recent Labs  Lab 10/06/24 1257  WBC 4.7  CREATININE 1.51*    CrCl cannot be calculated (Unknown ideal weight.).    Allergies[1]  Dorn Poot, PharmD, [facility] Clinical Pharmacist ED Pharmacist Phone # [phone] 10/06/2024 5:13 PM      [1]  Allergies Allergen Reactions   Atorvastatin Other (See Comments)   Flonase [Fluticasone]    Lisinopril

## 2024-10-06 NOTE — Progress Notes (Signed)
 Systolic blood pressure still low after 2 units of blood Plan Starting peripheral norepinephrine  Nursing report difficult IV access, will ask IV team to see if we can get peripheral site Adding midodrine  via tube as well If IV team cannot get IV access we will need to place central line later this evening

## 2024-10-06 NOTE — ED Provider Notes (Signed)
 " [place] EMERGENCY DEPARTMENT AT [facility] Provider Note   CSN: [id number] Arrival date & time: 10/06/24  1251     Patient presents with: bleeding at trach    Glen Powers is a 83 y.o. male.   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  Patient required tracheostomy and feeding tube placement.  Patient was transferred to [facility] facility for long-term care.  Patient was transferred here after they noticed bleeding from his tracheostomy site.  Patient's hemoglobin reportedly was down from 7.8-6.8.  Patient is not able to provide any history.  He opens his eyes when I speak to him but does not communicate.  Prior to Admission medications  Medication Sig Start Date End Date Taking? Authorizing Provider  amLODipine (NORVASC) 10 MG tablet Take 10 mg by mouth daily. 10/02/21   [provider]  B Complex Vitamins (VITAMIN B COMPLEX) TABS Take 1 tablet by mouth daily. 10/26/18   [provider]  BRUKINSA 80 MG CAPS Take 2 capsules by mouth 2 (two) times daily. 10/05/21   [provider]  isosorbide mononitrate (IMDUR) 60 MG 24 hr tablet Take 60 mg by mouth daily.    [provider]  metoprolol succinate (TOPROL-XL) 25 MG 24 hr tablet Take 25 mg by mouth daily.    [provider]  nitroGLYCERIN (NITROSTAT) 0.4 MG SL tablet Place 0.4 mg under the tongue every 5 (five) minutes as needed for chest pain.    [provider]  RA VITAMIN E-VIT A & D 20000 UNITS CREA Apply 20,000 Units topically daily.    [provider]  rivaroxaban (XARELTO) 20 MG TABS tablet Take by mouth. 06/10/21 06/10/22  [provider]  rosuvastatin  (CRESTOR ) 10 MG tablet Take 10 mg by mouth daily.    [provider]  torsemide (DEMADEX) 20 MG tablet Take 40 mg by mouth daily. 04/09/22   [provider]    Allergies: Flonase [fluticasone] and Lisinopril     Review of Systems  Updated Vital Signs BP 116/65 (BP Location: Right Wrist)   Pulse 83   Temp 98.1 F (36.7 C) (Axillary)   Resp 16   Ht 1.753 m (5' 9)   SpO2 93%   BMI 31.01 kg/m   Physical Exam Vitals and nursing note reviewed.  Constitutional:      General: He is not in acute distress.    Appearance: He is well-developed. He is ill-appearing.  HENT:     Head: Normocephalic and atraumatic.     Right Ear: External ear normal.     Left Ear: External ear normal.  Eyes:     General: No scleral icterus.       Right eye: No discharge.        Left eye: No discharge.     Conjunctiva/sclera: Conjunctivae normal.  Neck:     Trachea: No tracheal deviation.     Comments: No active bleeding noted from the tracheostomy, dried blood in the mouth Cardiovascular:     Rate and Rhythm: Normal rate and regular rhythm.  Pulmonary:     Effort: Pulmonary effort is normal. No respiratory distress.     Breath sounds: Normal breath sounds. No stridor. No wheezing or rales.  Abdominal:     General: Bowel sounds are normal. There is no distension.     Palpations: Abdomen is soft.     Tenderness: There is no abdominal tenderness. There  is no guarding or rebound.     Comments: Drainage tube in the right upper abdomen, lower chest  Musculoskeletal:        General: No tenderness or deformity.     Right lower leg: Edema present.     Left lower leg: Edema present.  Skin:    General: Skin is warm and dry.     Findings: No rash.  Neurological:     General: No focal deficit present.     Mental Status: He is alert.     Cranial Nerves: No cranial nerve deficit, dysarthria or facial asymmetry.     Sensory: No sensory deficit.     Motor: No abnormal muscle tone or seizure activity.     Coordination: Coordination normal.  Psychiatric:        Mood and Affect: Mood normal.     (all labs ordered are listed, but only abnormal results are displayed) Labs Reviewed  COMPREHENSIVE METABOLIC PANEL  WITH GFR - Abnormal; Notable for the following components:      Result Value   Chloride 94 (*)    CO2 36 (*)    Glucose, Bld 163 (*)    BUN 77 (*)    Creatinine, Ser 1.51 (*)    Calcium  8.2 (*)    Total Protein 6.3 (*)    Albumin 1.8 (*)    GFR, Estimated 45 (*)    All other components within normal limits  CBC - Abnormal; Notable for the following components:   RBC 1.85 (*)    Hemoglobin 5.7 (*)    HCT 19.4 (*)    MCV 104.9 (*)    MCHC 29.4 (*)    RDW 17.9 (*)    Platelets 144 (*)    nRBC 0.4 (*)    All other components within normal limits  CBG MONITORING, ED - Abnormal; Notable for the following components:   Glucose-Capillary 137 (*)    All other components within normal limits  PROTIME-INR  TYPE AND SCREEN  ABO/RH  PREPARE RBC (CROSSMATCH)    EKG: None  Radiology: No results found.   .Critical Care  Performed by: Randol Simmonds, MD Authorized by: Randol Simmonds, MD   Critical care provider statement:    Critical care time (minutes):  30   Critical care was time spent personally by me on the following activities:  Development of treatment plan with patient or surrogate, discussions with consultants, evaluation of patient's response to treatment, examination of patient, ordering and review of laboratory studies, ordering and review of radiographic studies, ordering and performing treatments and interventions, pulse oximetry, re-evaluation of patient's condition and review of old charts    Medications Ordered in the ED  0.9 %  sodium chloride  infusion (Manually program via Guardrails IV Fluids) (has no administration in time range)    Clinical Course as of 10/06/24 1425  Sun Oct 06, 2024  1353 CBC(!!) Hemoglobin decreased at 5.7 [JK]  1355 Patient unable to provide consent.  Will proceed with blood transfusion.  I will attempt to contact family [JK]  1357 Attempted to contact spouse no answer [JK]  1408 Wife is at the bedside.  She does verbally consent to the blood  transfusion. [JK]  1409 Comprehensive metabolic panel(!) Labs do show elevated BUN and creatinine.  Suspect this is related to his [JK]  1412 Discussed CODE STATUS with patient's wife.  She states patient is full code [JK]  1414 Patient's wife confirms that the tube in the right lower anterior  chest was a chest tube placed at [facility] [JK]    Clinical Course User Index [JK] Randol Simmonds, MD                                 Medical Decision Making Problems Addressed: Acute blood loss anemia: acute illness or injury that poses a threat to life or bodily functions Lymphoma, unspecified body region, unspecified lymphoma type [facility]): chronic illness or injury Tracheostomy complication, unspecified complication type [facility]): acute illness or injury  Amount and/or Complexity of Data Reviewed Labs: ordered. Decision-making details documented in ED Course. Radiology: ordered and independent interpretation performed.  Risk Prescription drug management. Decision regarding hospitalization.   Patient presented to the ED for evaluation of bleeding from the tracheostomy site.  Patient has very complicated medical history.  He has history of lymphoma that became progressively worse in the last 2 months of this year.  He required tracheostomy at [facility].  Wife states since leaving [facility] she feels like he has gotten worse.  Patient does not have any active bleeding right now but there is dried blood around the oropharynx.  His labs do show acute anemia as well as AKI increased BUN consistent with his acute bleeding.  Patient's wife agrees to blood transfusions.  I have ordered 2 units.  She confirms full CODE STATUS.  Patient's vitals are currently stable.  Case discussed with NP Jenna ,critical care,  regarding admission     Final diagnoses:  Acute blood loss anemia  Lymphoma, unspecified body region, unspecified lymphoma type [facility])  Tracheostomy complication, unspecified complication type  [facility])    ED Discharge Orders     None          Randol Simmonds, MD 10/06/24 1425  "

## 2024-10-06 NOTE — Progress Notes (Signed)
 Pt transported via vent to CT and back w/o complications. Pt assisted by RT and RN x2.

## 2024-10-06 NOTE — Consult Note (Addendum)
 "  NAME:  Glen Powers, MRN:  [id number], DOB:  Nov 27, 1940, LOS: 0 ADMISSION DATE:  10/06/2024, CONSULTATION DATE: 12/28 REFERRING MD:  knapp, CHIEF COMPLAINT: Anemia and multiple medical problems  History of Present Illness:  83 year old male referred to [facility] ER from [facility] for evaluation of anemia. Has complex h/o: Coronary artery disease, B-cell lymphoma previously treated in 2000 and 2023, congestive heart failure, coronary artery bypass grafting in 1998, hypertension, obstructive sleep apnea, squamous cell carcinoma, chronic anemia, recent laryngeal mass, biopsy positive for lymphoma, tracheostomy dependent. Hospitalized from October 30 to November 15 for failure to thrive, laryngeal mask at which time he had his trach placed, with hospital course complicated greatly by delirium and agitation, MRSA and H. influenzae pneumonia, severe deconditioning, and volume overload.  Of note in regards to the newly identified laryngeal mass he did undergo 2 rounds of radiation therapy while at [facility].  At time of discharge wife reported he was actually ambulating in the hallway, and participating in physical therapy. Was eventually discharged to [facility] in early Nov 2025 in hopes of rehab.  Family reports within a week of hospital admission at [facility] they noticed to significant decline in the patient's responsiveness.  Noting he had been started on antipsychotic medications for delirium resulting in fairly significant sedation.  He remained essentially in the bed from early December up to the point of arrival here at [facility] on the 28th.  He has had ongoing delirium for which he has been treated with Zyprexa , and noted anemia since which had worsened since arrival at [facility].  At the time of his discharge from [facility] his hemoglobin was 7.5 on 11/15.  Wife notes about 3 weeks ago prior to admission his hemoglobin was as low as 5.9 for which he was administered 2 units of blood.  He had initially been discharged on a  DOAC, he is not currently on DOAC, but wonder if it was discontinued at that time.  Since that time his hemoglobin has continued to drop, about a week after his initial transfusion his hemoglobin was up to 7.4, he has had weekly lab draws and the blood count has continued to fall each week.  His most recent hemoglobin was 6.3 for which he received his third unit of blood.  It is not clear what if any diagnostic evaluation had gone on as to the etiology of the blood loss, however because the hemoglobin once again had dropped from 7.8 down to 6.8 he was referred to the emergency room for evaluation.  [facility] staff noting possible blood in tracheostomy, this has not been noted on our evaluation or from the emergency room staff however it was identified blood in the oral cavity and on his tongue  Diagnostic evaluation in the emergency room Chest x-ray showing right sided airspace disease plus minus small right effusion with PleurX catheter in place Capillary blood glucose 135 INR 1.1 White blood cell count 1.85 Hemoglobin 5.7 Platelets 144 Creatinine 1.51 with BUN of 77 albumin 1.8 Pertinent  Medical History  CAD, colon cancer, B cell lymphoma (previously treated in 2000 and 2022 initially w/ rituximab and then ibrutinib,  not treated since his admit at [facility] ), 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, rectal mass) MRSA PNA & HFlu PNA, CAD, CHF, prior CABG 1998, HTN, OSA, squamous cell carcinoma of skin, Chronic af Admitted 10/30 w/ FTT and dysphagia and found to have the throat mass Trach placed 11/1 bx + lymphoma Trach redo 11/4 after  pt pulled out trach and created false passage   Significant Hospital Events: Including procedures, antibiotic start and stop dates in addition to other pertinent events   12/28 admitted  for multiple medical problems including pneumonia, acute on chronic anemia, delirium, and failure to thrive  Interim History / Subjective:  Agitated and  restless at times  Objective    Blood pressure 116/65, pulse 83, temperature 98.1 F (36.7 C), temperature source Axillary, resp. rate 16, height 5' 9 (1.753 m), SpO2 93%.    Vent Mode: PCV FiO2 (%):  [50 %] 50 % Set Rate:  [12 bmp] 12 bmp PEEP:  [5 cmH20] 5 cmH20  No intake or output data in the 24 hours ending 10/06/24 1447 There were no vitals filed for this visit.  Examination: General: Chronically ill-appearing 83 year old male patient currently on mechanical ventilator does not appear to be in distress but is agitated HENT: Normocephalic mucous membranes are very dry he has blood on his tongue, no pooling blood, no obvious ulcerations.  Tracheostomy is midline has a 6 proximal Shiley I do not see bloody secretions from this Lungs: Diffuse rales, chest x-ray shows right sided airspace disease plus minus effusion has a PleurX catheter in place currently on full ventilator support.  Has a PleurX catheter in place, not currently draining anything. Cardiovascular: Regular irregular A-fib Abdomen: Soft, PEG tube in place Extremities: Diffuse anasarca Neuro: Awake, somewhat agitated, moving all extremities, reaching for tubes and lines GU: Clear yellow  Resolved problem list   Assessment and Plan  Acute on chronic anemia. Has a history of lymphoma with both laryngeal mass as well as a rectal mass.  No report of bright red blood per rectum, but blood in the mouth could certainly be from oozing from the mass.  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  Secondary to  healthcare associated pneumonia Plan Continuing current mode of ventilation Assess respiratory culture Broad-spectrum hcap coverage PAD protocol RASS 1  Chronic right pleural effusion Currently has Pleur-evac in place.  The vacuum container is decompressed.  It has not been draining. Plan Will For now once he arrives in the ICU Reassess a.m. chest x-ray If fluid reaccumulates would obtain pleural fluid culture and place to [place] suction  History of coronary artery disease, remote CABG, and heart failure.  I do not know his ejection fraction Plan Holding antihypertensives Telemetry monitoring Keep euvolemic  History of A-fib Plan Telemetry monitoring Holding DOAC Rate control   Hypotension, with normal lactate.  Not currently meeting SIRS criteria however does have pneumonia Plan Administer blood Could add midodrine  May need norepinephrine  Panculture   AKI with marked total volume overload Plan Holding diuresis for now given hypotension Volume resuscitation with blood We will eventually need to add back diuresis  Thrombocytopenia Plan Trend  Delirium.  Seems to have been refractory over several weeks.  This reflects a very poor prognostic sign Plan Will start to cut back on his Zyprexa  Precedex  for RASS goal -1 Supportive care Add thiamine  and folate  Hyperglycemia Plan Sliding scale insulin   Lymphoma, with known laryngeal mass, also rectal mass.  Apparently  has had imaging at some point recently.  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 Discussed with the wife and family his chronic illness.  Likelihood given his trajectory that he will not be a candidate for any cancer therapy in the future.  And the poor  prognostic sign of prolonged delirium.  Have recommended DNR status should he arrest.  The family agrees with this Plan DNR if he arrest, no defibrillation CPR If he were to require continued escalation of vasoactive drips family would be open to further discussion I have told them he is not a candidate for dialysis should he develop progressive renal failure and they agree with this     Labs   CBC: Recent Labs  Lab 10/06/24 1257  WBC 4.7  HGB 5.7*  HCT 19.4*  MCV 104.9*  PLT 144*    Basic Metabolic Panel: Recent Labs  Lab 10/06/24 1257  NA 138  K 4.8  CL 94*  CO2 36*  GLUCOSE 163*  BUN 77*  CREATININE 1.51*  CALCIUM  8.2*   GFR: CrCl cannot be calculated (Unknown ideal weight.). Recent Labs  Lab 10/06/24 1257  WBC 4.7    Liver Function Tests: Recent Labs  Lab 10/06/24 1257  AST 34  ALT 19  ALKPHOS 85  BILITOT 0.8  PROT 6.3*  ALBUMIN 1.8*   No results for input(s): LIPASE, AMYLASE in the last 168 hours. No results for input(s): AMMONIA in the last 168 hours.  ABG No results found for: PHART, PCO2ART, PO2ART, HCO3, TCO2, ACIDBASEDEF, O2SAT   Coagulation Profile: Recent Labs  Lab 10/06/24 1317  INR 1.1    Cardiac Enzymes: No results for input(s): CKTOTAL, CKMB, CKMBINDEX, TROPONINI in the last 168 hours.  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   Surgical History:   Past Surgical History:  Procedure Laterality Date   CORONARY ARTERY BYPASS GRAFT     2 vessel   FACIAL LACERATIONS REPAIR  1993   Facial trauma   WRIST FRACTURE SURGERY  1993      Social History:   reports that he quit smoking about 57 years ago. His smoking use included cigarettes. He has never used smokeless tobacco. He reports that he does not currently use alcohol.   Family History:  His family history is negative for Coronary artery disease.   Allergies Allergies[1]   Home Medications  Prior to Admission medications  Medication Sig Start Date End Date Taking? Authorizing Provider  amLODipine (NORVASC) 10 MG tablet Take 10 mg by mouth daily. 10/02/21   [provider]  B Complex Vitamins (VITAMIN B COMPLEX) TABS Take 1 tablet by mouth daily. 10/26/18   [provider]  BRUKINSA 80 MG CAPS Take 2 capsules by mouth 2 (two) times daily. 10/05/21   [provider]  isosorbide mononitrate (IMDUR) 60 MG 24 hr tablet Take 60 mg by mouth daily.    [provider]  metoprolol succinate (TOPROL-XL) 25 MG 24 hr tablet Take 25 mg by mouth daily.    [provider]  nitroGLYCERIN (NITROSTAT) 0.4 MG SL tablet Place 0.4 mg under the tongue every 5 (five) minutes as needed for chest pain.    [provider]  RA VITAMIN E-VIT A & D 20000 UNITS CREA Apply 20,000 Units topically daily.    [provider]  rivaroxaban (XARELTO) 20 MG TABS tablet Take by mouth. 06/10/21 06/10/22  [provider]  rosuvastatin  (CRESTOR ) 10 MG tablet Take 10 mg by mouth daily.    [provider]  torsemide (DEMADEX) 20 MG tablet Take 40 mg by mouth daily. 04/09/22   [provider]     Critical care time:  I personally  spent 65 minutes  on this patient which included: review of medical records, nursing notes, progress notes, evaluation, interpretation of lab data and diagnostic studies, taking independent history, performing exam, documenting plan, ordering diagnostics and interventions for the following critical care issues: Circulatory shock, Acute respiratory failure, Acute toxic and or metabolic encephalopathy,  Acute renal failure, Severe metabolic derangements with the following interventions which included: titration of ventilatory support, titration of hemodynamic drips to desired MAP, evaluation of hemodynamics and administration of IV fluid resuscitation , prevention of further deterioration                [1]  Allergies Allergen Reactions   Flonase [Fluticasone]    Lisinopril    "

## 2024-10-06 NOTE — ED Triage Notes (Signed)
 Pt BIB [facility] d/t bleeding from his trach. No active bleeding. Hgb at facility drooped from 7.8 to 6.8 since last week. Bedbound. Non-verbal. Total care. Staff sucked out quarter size blood clots. One from mouth.    114/46 HR 88 sinus  98 on vent

## 2024-10-06 NOTE — ED Notes (Signed)
 Pt's BP is 82/49. Icu NP made aware. - was instructed to go to bump up the blood transfusion rate and see it that helps before starting pressor.

## 2024-10-07 ENCOUNTER — Inpatient Hospital Stay (HOSPITAL_COMMUNITY)

## 2024-10-07 LAB — BASIC METABOLIC PANEL WITH GFR
Anion gap: 10 (ref 5–15)
BUN: 77 mg/dL — ABNORMAL HIGH (ref 8–23)
CO2: 34 mmol/L — ABNORMAL HIGH (ref 22–32)
Calcium: 8.2 mg/dL — ABNORMAL LOW (ref 8.9–10.3)
Chloride: 95 mmol/L — ABNORMAL LOW (ref 98–111)
Creatinine, Ser: 1.51 mg/dL — ABNORMAL HIGH (ref 0.61–1.24)
GFR, Estimated: 45 mL/min — ABNORMAL LOW
Glucose, Bld: 109 mg/dL — ABNORMAL HIGH (ref 70–99)
Potassium: 4.6 mmol/L (ref 3.5–5.1)
Sodium: 140 mmol/L (ref 135–145)

## 2024-10-07 LAB — CBC
HCT: 27 % — ABNORMAL LOW (ref 39.0–52.0)
Hemoglobin: 8.5 g/dL — ABNORMAL LOW (ref 13.0–17.0)
MCH: 31.1 pg (ref 26.0–34.0)
MCHC: 31.5 g/dL (ref 30.0–36.0)
MCV: 98.9 fL (ref 80.0–100.0)
Platelets: 175 K/uL (ref 150–400)
RBC: 2.73 MIL/uL — ABNORMAL LOW (ref 4.22–5.81)
RDW: 19.9 % — ABNORMAL HIGH (ref 11.5–15.5)
WBC: 9.5 K/uL (ref 4.0–10.5)
nRBC: 0.3 % — ABNORMAL HIGH (ref 0.0–0.2)

## 2024-10-07 LAB — BPAM RBC
Blood Product Expiration Date: 202601242359
Unit Type and Rh: 5100

## 2024-10-07 LAB — CBG MONITORING, ED: Glucose-Capillary: 109 mg/dL — ABNORMAL HIGH (ref 70–99)

## 2024-10-07 LAB — PHOSPHORUS: Phosphorus: 5.1 mg/dL — ABNORMAL HIGH (ref 2.5–4.6)

## 2024-10-07 LAB — MAGNESIUM: Magnesium: 2.9 mg/dL — ABNORMAL HIGH (ref 1.7–2.4)

## 2024-10-07 LAB — MRSA NEXT GEN BY PCR, NASAL: MRSA by PCR Next Gen: DETECTED — AB

## 2024-10-07 MED ORDER — SUCCINYLCHOLINE CHLORIDE 200 MG/10ML IV SOSY
PREFILLED_SYRINGE | INTRAVENOUS | Status: AC
  Filled 2024-10-07: qty 10

## 2024-10-07 MED ORDER — EPINEPHRINE 1 MG/10ML IV SOSY
PREFILLED_SYRINGE | INTRAVENOUS | Status: AC | PRN
  Administered 2024-10-07 (×4): 1 mg via INTRAVENOUS

## 2024-10-07 MED ORDER — SODIUM CHLORIDE 0.9% IV SOLUTION: Freq: Once | INTRAVENOUS | Status: DC

## 2024-10-07 MED ORDER — PHENYLEPHRINE 80 MCG/ML (10ML) SYRINGE FOR IV PUSH (FOR BLOOD PRESSURE SUPPORT)
PREFILLED_SYRINGE | INTRAVENOUS | Status: AC
  Filled 2024-10-07: qty 10

## 2024-10-07 MED ORDER — KETAMINE HCL 50 MG/5ML IJ SOSY
PREFILLED_SYRINGE | INTRAMUSCULAR | Status: AC
  Filled 2024-10-07: qty 5

## 2024-10-07 MED ORDER — ALBUTEROL SULFATE (2.5 MG/3ML) 0.083% IN NEBU: 2.5000 mg | INHALATION_SOLUTION | Freq: Four times a day (QID) | RESPIRATORY_TRACT | Status: DC

## 2024-10-07 MED ORDER — SODIUM BICARBONATE 8.4 % IV SOLN
INTRAVENOUS | Status: AC | PRN
  Administered 2024-10-07 (×2): 50 meq via INTRAVENOUS

## 2024-10-07 MED ORDER — SODIUM CHLORIDE 3 % IN NEBU
4.0000 mL | INHALATION_SOLUTION | Freq: Four times a day (QID) | RESPIRATORY_TRACT | Status: DC
  Filled 2024-10-07 (×3): qty 4

## 2024-10-07 MED ORDER — ETOMIDATE 2 MG/ML IV SOLN
INTRAVENOUS | Status: AC
  Filled 2024-10-07: qty 10

## 2024-10-09 LAB — BPAM RBC
Blood Product Expiration Date: 202601192359
Blood Product Expiration Date: 202601202359
Blood Product Expiration Date: 202601242359
Blood Product Expiration Date: 202601262359
ISSUE DATE / TIME: 202512281536
ISSUE DATE / TIME: 202512281757
ISSUE DATE / TIME: 202512290611
ISSUE DATE / TIME: 202512290611
Unit Type and Rh: 5100
Unit Type and Rh: 5100
Unit Type and Rh: 6200
Unit Type and Rh: 6200

## 2024-10-09 LAB — TYPE AND SCREEN
ABO/RH(D): A POS
Antibody Screen: NEGATIVE
Unit division: 0
Unit division: 0
Unit division: 0
Unit division: 0

## 2024-10-09 LAB — BLOOD CULTURE ID PANEL (REFLEXED) - BCID2

## 2024-10-10 LAB — CULTURE, BLOOD (ROUTINE X 2)

## 2024-10-10 NOTE — ED Notes (Signed)
 RT at bedside attempting to remove and replace a new trach tube.

## 2024-10-10 NOTE — ED Notes (Signed)
 Gleason PA and Maree MD notified of patient trach becoming dislodged per x-ray. Messaged for presence at bedside.

## 2024-10-10 NOTE — ED Notes (Signed)
 RT contacted to come to bedside due to xray result of trach dislodgement. RT came to bedside STAT.

## 2024-10-10 NOTE — ED Notes (Signed)
 Time of death 33 called by C. Horton, MD.

## 2024-10-10 NOTE — ED Notes (Signed)
 Spoke w/ Lauren PA at bedside. She was made aware of pts HR tachy, afib in the 110s. BP stable. NO new orders at this time. PA stated to contact CCT if pt sustains in the 120s.

## 2024-10-10 NOTE — Code Documentation (Addendum)
 1unit of blood started at 0615 via [address]

## 2024-10-10 NOTE — ED Notes (Signed)
 RT at bedside to suction pt. Pt here d/t bleeding from trach, blood tinged secretions noted by RT when pt was suctioned.

## 2024-10-10 NOTE — Code Documentation (Signed)
 Asystole, Compressions continued.

## 2024-10-10 NOTE — ED Provider Notes (Signed)
 Received a call from radiology around 6 AM that the patient had a dislodged tracheostomy and a whiteout of the right lung which was new.  Patient holding for ICU bed.  Unknown to me and was admitted by my colleague.  Per nursing he is being transported to ICU.  Requested that they not transport until his tracheostomy could be addressed.  Requested RT to the bedside and paged ICU.  Also sent ICU a message.  On my brief evaluation initially, patient with trach in place.  O2 sats 90%.  6:10a Noted the patient was decompensating.  ICU midlevel at the bedside.  Patient bleeding around and at trach and out of his mouth.  Desaturating.  Patient was periarrest.  Requested code cart and airway cart to the bedside.  Patient ultimately lost pulses.  See code documentation for full details.  Patient intubated during code.  Had massive exsanguination through the ET tube.  Unclear source of bleed.  Difficult to bag.  Patient got 2 rounds of CPR.  Continued to bleed massively through the ET tube and through his mouth.  Requested emergency release blood.  In the meantime, we discussed with patient's wife.  She is outside in the hallway.  I introduced myself.  Discussed the patient's dire prognosis given baseline health and medical problems.  Wife verbalized that she did not want him to die and she did not want to say stop.  I acknowledged her feelings regarding this.  Also discussed with her that if we were able to get his heart to restock, it would be very unlikely that he would ever recover meaningfully.  Ultimately the decision was made to cease efforts.  Time of death 93.  Debrief with ICU midlevel and respiratory therapy.  Wife was provided with support. Physical Exam  BP 129/71   Pulse (!) 103   Temp 97.9 F (36.6 C) (Axillary)   Resp (!) 21   Ht 1.753 m (5' 9)   Wt 83 kg   SpO2 94%   BMI 27.02 kg/m   Physical Exam Ill, obtunded Bleeding from mouth and tracheostomy site Procedures  .Critical  Care  Performed by: Bari Charmaine FALCON, MD Authorized by: Bari Charmaine FALCON, MD   Critical care provider statement:    Critical care time (minutes):  31   Critical care was necessary to treat or prevent imminent or life-threatening deterioration of the following conditions:  Cardiac failure and respiratory failure   Critical care was time spent personally by me on the following activities:  Development of treatment plan with patient or surrogate, discussions with consultants, evaluation of patient's response to treatment, examination of patient, ordering and review of laboratory studies, ordering and review of radiographic studies, ordering and performing treatments and interventions, pulse oximetry, re-evaluation of patient's condition and review of old charts Procedure Name: Intubation Date/Time: 10-27-24 6:42 AM  Performed by: Bari Charmaine FALCON, MDPre-anesthesia Checklist: Patient identified, Patient being monitored, Emergency Drugs available, Timeout performed and Suction available Oxygen Delivery Method: Non-rebreather mask Preoxygenation: Pre-oxygenation with 100% oxygen Induction Type: Rapid sequence Ventilation: Mask ventilation without difficulty Laryngoscope Size: Glidescope, Mac and 4 Grade View: Grade IV Tube size: 7.5 mm Number of attempts: 1 Airway Equipment and Method: Video-laryngoscopy Comments: Unable to verify given code situation.  However increase in O2 sats.  Massive amount of bleeding through ET tube.    CPR  Date/Time: 10-27-2024 6:43 AM  Performed by: Bari Charmaine FALCON, MD Authorized by: Bari Charmaine FALCON, MD  CPR Procedure Details:  Amount of time prior to administration of ACLS/BLS (minutes):  0   ACLS/BLS initiated by EMS: No     CPR/ACLS performed in the ED: Yes     Duration of CPR (minutes):  10   Outcome: Pt declared dead    CPR performed via ACLS guidelines under my direct supervision.  See RN documentation for details including  defibrillator use, medications, doses and timing.   ED Course / MDM   Clinical Course as of 10/11/2024 0636  Sun Oct 06, 2024  1353 CBC(!!) Hemoglobin decreased at 5.7 [JK]  1355 Patient unable to provide consent.  Will proceed with blood transfusion.  I will attempt to contact family [JK]  1357 Attempted to contact spouse no answer [JK]  1408 Wife is at the bedside.  She does verbally consent to the blood transfusion. [JK]  1409 Comprehensive metabolic panel(!) Labs do show elevated BUN and creatinine.  Suspect this is related to his [JK]  1412 Discussed CODE STATUS with patient's wife.  She states patient is full code [JK]  1414 Patient's wife confirms that the tube in the right lower anterior chest was a chest tube placed at [facility] [JK]    Clinical Course User Index [JK] Randol Simmonds, MD   Medical Decision Making Amount and/or Complexity of Data Reviewed Labs: ordered. Decision-making details documented in ED Course. Radiology: ordered.  Risk Prescription drug management. Decision regarding hospitalization.          Bari Charmaine FALCON, MD 11-Oct-2024 [phone]

## 2024-10-10 NOTE — Code Documentation (Signed)
 Intubation still in process, pt bleeding severely from tube, pt still asystole.

## 2024-10-10 NOTE — Code Documentation (Signed)
 TOD called by Bari, MD

## 2024-10-10 NOTE — ED Notes (Signed)
 Provided patient with pink mouth swabs

## 2024-10-10 NOTE — ED Notes (Signed)
 [facility] PA arrived at bedside.

## 2024-10-10 NOTE — Code Documentation (Signed)
"  Pulse check, no pulse, compressions resumed  "

## 2024-10-10 NOTE — Code Documentation (Signed)
 9393 CPR started

## 2024-10-10 NOTE — ED Notes (Signed)
 Pt becoming more agitated at this time, pulling at his EKG leads and purewick. [facility] PA contacted and okay w/ moving forward w/ Precedex .

## 2024-10-10 NOTE — Code Documentation (Signed)
 Pulse check, pt asystole, resumed compressions

## 2024-10-10 NOTE — ED Notes (Signed)
 Pts bilateral arms are severely swollen and edematous w/ 4+ pitting. Pt has bilateral IVs, checked for infiltration, negative for infiltration w/ positive blood return. Will continue to monitor.

## 2024-10-10 NOTE — Significant Event (Signed)
 Asked to come to the ER as the patient's trach appeared dislodged on AM CXR, when I arrived RT had tried to replace the trach but met with resistance and increasing bleeding to had replace the old one with sats dropping and hemorrhaging from the trach site.  ED physician was able to intubate, however there was massive bleeding from the airway and pt lost pulses.  Code blue initiated along with blood transfusion from with the rapid infuser, however despite approximately 5 rounds of CPR, massive bleeding continued and pt never regained pulses.  Pt's wife gave permission to stop compressions.  I spoke with her and she was not interested in pursuing medical examiner evaluation.     Leita SAUNDERS Dj Senteno, PA-C

## 2024-10-10 NOTE — Progress Notes (Signed)
" °   10-Oct-2024 0716  Spiritual Encounters  Type of Visit Initial  Care provided to: Family  Referral source Chaplain assessment  Reason for visit Patient death  OnCall Visit Yes  Spiritual Framework  Presenting Themes Impactful experiences and emotions  Patient Stress Factors Major life changes  Family Stress Factors Exhausted;Health changes;Major life changes;Loss of control  Interventions  Spiritual Care Interventions Made Compassionate presence;Established relationship of care and support;Normalization of emotions  Intervention Outcomes  Outcomes Awareness of support   Chaplain responded to a Code Blue. Spouse was present at the bedside as the medical team was provided care. Despite resuscitative efforts, the Pt passed away. Chaplain remained with the spouse  offering spiritual presence and emotional support during the time of loss.   Placement card was provided to assist with future arrangements. Chaplain remains available for continued support as needed. "

## 2024-10-10 NOTE — Code Documentation (Signed)
Pulse check, asystole, compressions resumed   

## 2024-10-10 NOTE — ED Notes (Signed)
 Pt tolerating Precedex , much less agitated.

## 2024-10-10 DEATH — deceased

## 2024-10-11 LAB — CULTURE, BLOOD (ROUTINE X 2): Culture: NO GROWTH
